# Patient Record
Sex: Male | Born: 2000 | Race: White | Hispanic: No | Marital: Married | Smoking: Never smoker
Health system: Southern US, Community
[De-identification: ages and names within clinical notes are randomized; demographics above are authoritative.]

## PROBLEM LIST (undated history)

## (undated) DIAGNOSIS — F32A Depression, unspecified: Secondary | ICD-10-CM

## (undated) DIAGNOSIS — G249 Dystonia, unspecified: Secondary | ICD-10-CM

## (undated) DIAGNOSIS — F840 Autistic disorder: Secondary | ICD-10-CM

## (undated) DIAGNOSIS — Z87442 Personal history of urinary calculi: Secondary | ICD-10-CM

## (undated) DIAGNOSIS — F909 Attention-deficit hyperactivity disorder, unspecified type: Secondary | ICD-10-CM

## (undated) DIAGNOSIS — F419 Anxiety disorder, unspecified: Secondary | ICD-10-CM

## (undated) HISTORY — DX: Anxiety disorder, unspecified: F41.9

## (undated) HISTORY — DX: Dystonia, unspecified: G24.9

## (undated) HISTORY — DX: Attention-deficit hyperactivity disorder, unspecified type: F90.9

## (undated) HISTORY — DX: Personal history of urinary calculi: Z87.442

## (undated) HISTORY — PX: HAND SURGERY: SHX662

## (undated) HISTORY — PX: WISDOM TOOTH EXTRACTION: SHX21

## (undated) HISTORY — DX: Depression, unspecified: F32.A

---
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
## 2018-01-11 ENCOUNTER — Encounter: Payer: Self-pay | Admitting: Family Medicine

## 2018-01-11 ENCOUNTER — Other Ambulatory Visit: Payer: Self-pay

## 2018-01-11 ENCOUNTER — Ambulatory Visit (INDEPENDENT_AMBULATORY_CARE_PROVIDER_SITE_OTHER): Payer: BC Managed Care – PPO

## 2018-01-11 ENCOUNTER — Ambulatory Visit (INDEPENDENT_AMBULATORY_CARE_PROVIDER_SITE_OTHER): Payer: BC Managed Care – PPO | Admitting: Family Medicine

## 2018-01-11 VITALS — BP 110/66 | HR 82 | Temp 97.6°F | Ht 72.5 in | Wt 197.5 lb

## 2018-01-11 DIAGNOSIS — M79672 Pain in left foot: Secondary | ICD-10-CM

## 2018-01-11 NOTE — Patient Instructions (Signed)
Foot Sprain A foot sprain is an injury to one of the strong bands of tissue (ligaments) that connect and support the many bones in your feet. The ligament can be stretched too much or it can tear. A tear can be either partial or complete. The severity of the sprain depends on how much of the ligament was damaged or torn. What are the causes? A foot sprain is usually caused by suddenly twisting or pivoting your foot. What increases the risk? This injury is more likely to occur in people who:  Play a sport, such as basketball or football.  Exercise or play a sport without warming up.  Start a new workout or sport.  Suddenly increase how long or hard they exercise or play a sport.  What are the signs or symptoms? Symptoms of this condition start soon after an injury and include:  Pain, especially in the arch of the foot.  Bruising.  Swelling.  Inability to walk or use the foot to support body weight.  How is this diagnosed? This condition is diagnosed with a medical history and physical exam. You may also have imaging tests, such as:  X-rays to make sure there are no broken bones (fractures).  MRI to see if the ligament has torn.  How is this treated? Treatment varies depending on the severity of your sprain. Mild sprains can be treated with rest, ice, compression, and elevation (RICE). If your ligament is overstretched or partially torn, treatment usually involves keeping your foot in a fixed position (immobilization) for a period of time. To help you do this, your health care provider will apply a bandage, splint, or walking boot to keep your foot from moving until it heals. You may also be advised to use crutches or a scooter for a few weeks to avoid bearing weight on your foot while it is healing. If your ligament is fully torn, you may need surgery to reconnect the ligament to the bone. After surgery, a cast or splint will be applied and will need to stay on your foot while it  heals. Your health care provider may also suggest exercises or physical therapy to strengthen your foot. Follow these instructions at home: If You Have a Bandage, Splint, or Walking Boot:  Wear it as directed by your health care provider. Remove it only as directed by your health care provider.  Loosen the bandage, splint, or walking boot if your toes become numb and tingle, or if they turn cold and blue. Bathing  If your health care provider approves bathing and showering, cover the bandage or splint with a watertight plastic bag to protect it from water. Do not let the bandage or splint get wet. Managing pain, stiffness, and swelling  If directed, apply ice to the injured area: ? Put ice in a plastic bag. ? Place a towel between your skin and the bag. ? Leave the ice on for 20 minutes, 2-3 times per day.  Move your toes often to avoid stiffness and to lessen swelling.  Raise (elevate) the injured area above the level of your heart while you are sitting or lying down. Driving  Do not drive or operate heavy machinery while taking pain medicine.  Ask your health care provider when it is safe to drive if you have a bandage, splint, or walking boot on your foot. Activity  Rest as directed by your health care provider.  Do not use the injured foot to support your body weight until your health   care provider says that you can. Use crutches or other supportive devices as directed by your health care provider.  Ask your health care provider what activities are safe for you. Gradually increase how much and how far you walk until your health care provider says it is safe to return to full activity.  Do any exercise or physical therapy as directed by your health care provider. General instructions  If a splint was applied, do not put pressure on any part of it until it is fully hardened. This may take several hours.  Take medicines only as directed by your health care provider. These  include over-the-counter medicines and prescription medicines.  Keep all follow-up visits as directed by your health care provider. This is important.  When you can walk without pain, wear supportive shoes that have stiff soles. Do not wear flip-flops, and do not walk barefoot. Contact a health care provider if:  Your pain is not controlled with medicine.  Your bruising or swelling gets worse or does not get better with treatment.  Your splint or walking boot is damaged. Get help right away if:  You develop severe numbness or tingling in your foot.  Your foot turns blue, white, or gray, and it feels cold. This information is not intended to replace advice given to you by your health care provider. Make sure you discuss any questions you have with your health care provider. Document Released: 09/26/2001 Document Revised: 09/12/2015 Document Reviewed: 02/07/2014 Elsevier Interactive Patient Education  2018 [employer]  

## 2018-01-11 NOTE — Progress Notes (Signed)
  Subjective:     Patient ID: Russell DeisGeorge Lynn Rhome Jr., male   DOB: 07/30/2000, 17 y.o.   MRN: [id number]  HPI  Seen with left foot pain.  Was seen here a few weeks ago for similar.  Denies specific injury.  Recent change of shoes but is not sure that is the culprit.  Pain is somewhat poorly localized still somewhat midfoot and somewhat medial.  Still no swelling or redness or bruising.  Ambulating without limp. No ankle pain.  Pain is relatively mild.  Past Medical History:  Diagnosis Date  . ADHD (attention deficit hyperactivity disorder)   . Autism    Past Surgical History:  Procedure Laterality Date  . WISDOM TOOTH EXTRACTION      reports that he has never smoked. He has never used smokeless tobacco. He reports that he does not drink alcohol or use drugs. family history includes Diabetes in his father; Kidney Stones in his father and mother. Allergies  Allergen Reactions  . Other     Cayenne pepper causes diarrhea     Review of Systems  Constitutional: Negative for chills and fever.  Neurological: Negative for weakness and numbness.       Objective:   Physical Exam  Constitutional: He appears well-developed and well-nourished.  Cardiovascular: Normal rate and regular rhythm.  Musculoskeletal:  Ankle full range of motion.  Left foot no swelling, redness, or bruising.  Minimal tenderness along the mid aspect of the foot but very poorly localized.  Minimal tenderness first metatarsal midshaft.       Assessment:     Nonspecific left foot pain of one-month duration.  No reported injury.  Question mild sprain    Plan:     -Check x-rays of left foot to further evaluate-no acute bony abnormality seen and this will be over read -Recommend good comfortable shoe wear -He will try over-the-counter anti-inflammatory as needed -Recommend touch base in 1 month if pain not resolving.  See no indication for MRI at this time

## 2018-03-22 ENCOUNTER — Ambulatory Visit: Payer: BC Managed Care – PPO | Admitting: Family Medicine

## 2018-03-22 ENCOUNTER — Encounter: Payer: Self-pay | Admitting: Family Medicine

## 2018-03-22 ENCOUNTER — Other Ambulatory Visit: Payer: Self-pay

## 2018-03-22 VITALS — BP 120/84 | HR 82 | Temp 98.2°F | Ht 73.5 in | Wt 199.8 lb

## 2018-03-22 DIAGNOSIS — S61412A Laceration without foreign body of left hand, initial encounter: Secondary | ICD-10-CM | POA: Diagnosis not present

## 2018-03-22 NOTE — Progress Notes (Signed)
  Subjective:     Patient ID: Russell DeisGeorge Lynn Hartje Jr., male   DOB: 05/23/2000, 17 y.o.   MRN: [id number]  HPI Patient seen with laceration left hand near the base of the left thumb- left thenar eminence.  This occurred around 11:30 AM today.  He cut this on a piece of glass.  He had a printer surface that he was replacing with a glass surface and the glass ruptured.  He is not aware of any foreign bodies.  He had some mild bleeding afterwards.  He had tetanus 2018.  He went to another office and they apparently bandaged this and he was not a patient there and he was sent this way.  He has had full sensation in the left thumb.  Full range of motion all digits.  No pain.  Past Medical History:  Diagnosis Date  . ADHD (attention deficit hyperactivity disorder)   . Autism    Past Surgical History:  Procedure Laterality Date  . WISDOM TOOTH EXTRACTION      reports that he has never smoked. He has never used smokeless tobacco. He reports that he does not drink alcohol or use drugs. family history includes Diabetes in his father; Kidney Stones in his father and mother. Allergies  Allergen Reactions  . Other     Cayenne pepper causes diarrhea     Review of Systems  Neurological: Negative for weakness and numbness.       Objective:   Physical Exam  Constitutional: He appears well-developed and well-nourished.  Cardiovascular: Normal rate and regular rhythm.  Skin:  Has approximately 1-1/2 cm vertically oriented minimally gaping laceration near the base of the left thumb-volar surface of thenar eminence.  No active bleeding.  No foreign body seen or palpated.  Full range of motion left thumb       Assessment:     Uncomplicated laceration left hand- thenar eminence..  No foreign bodies noted.    Plan:     -We have recommended suture repair.  He would be at high risk of opening this back up and slow healing given location-without repair. -pt consented to laceration repair. -prepped  skin with betadine.  Anesthesia locally with 1% plain Xylocaine.  Irrigated wound with sterile saline Closed with 2 sutures of 4-0 ethilon.  Minimal bleeding. Topical dressing applied. -- wound care instruction given. -return in 8 days for suture removal and sooner as needed for any signs of infection.  Kristian CoveyBruce W Arvon Schreiner MD [facility]

## 2018-03-22 NOTE — Patient Instructions (Signed)
Keep wound dry for 24 hours Clean daily with soap and water after 24 hours Consider small amount of topical Vaseline to prevent drying Keep wound covered until follow-up Return in 8-10 days for suture removal Follow-up sooner for any signs of secondary infection such as pus drainage, increased swelling, or increased redness.

## 2018-04-01 ENCOUNTER — Ambulatory Visit: Payer: BC Managed Care – PPO | Admitting: Family Medicine

## 2018-04-19 IMAGING — CT CT RENAL STONE PROTOCOL
2 of 4 series · 16 of 46 positions shown, 18 images · non-contrast
Comparison: None

CLINICAL DATA: Intermittent LEFT flank pain since 9979 hours today
question kidney stone disease

EXAM:
CT ABDOMEN AND PELVIS WITHOUT CONTRAST
TECHNIQUE: Multidetector CT imaging of the abdomen and pelvis was performed
following the standard protocol without IV contrast. Oral contrast
was not administered for this indication. Sagittal and coronal MPR
images reconstructed from axial data set.

[Series 2: axial st · axial · 0.76mm/px · z∈[+1132,+1578]mm · 13 of 99 slices shown, 15 images]
[im 5/99  soft-tissue]
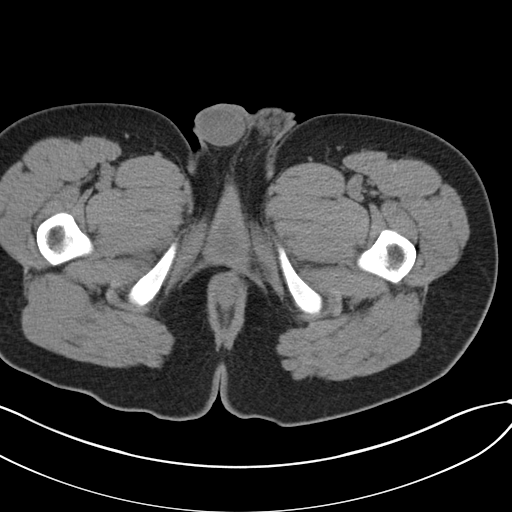
[im 5/99  bone]
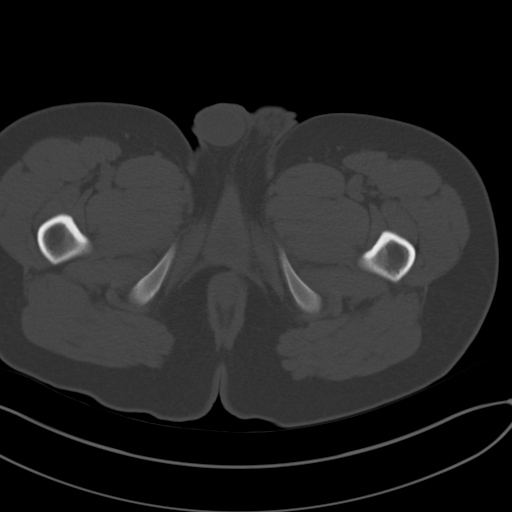
[im 15/99  soft-tissue]
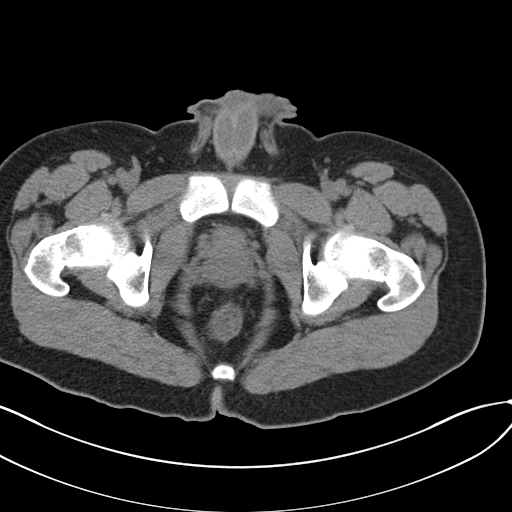
[im 19/99  soft-tissue]
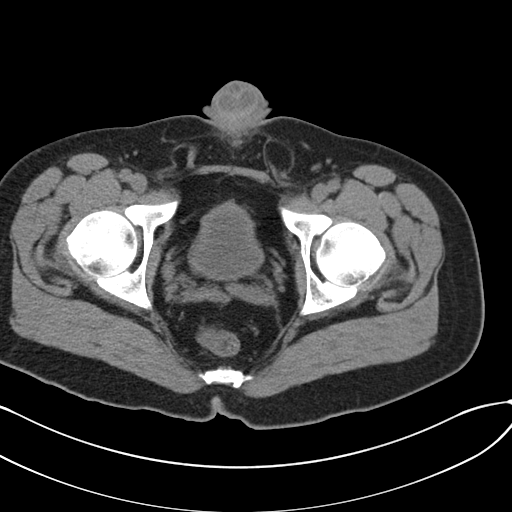
[im 29/99  soft-tissue]
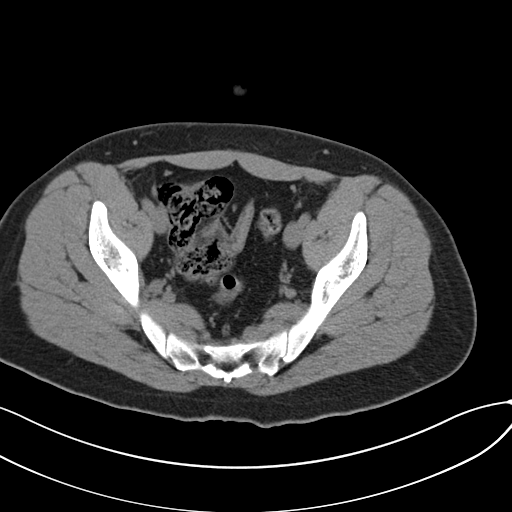
[im 33/99  soft-tissue]
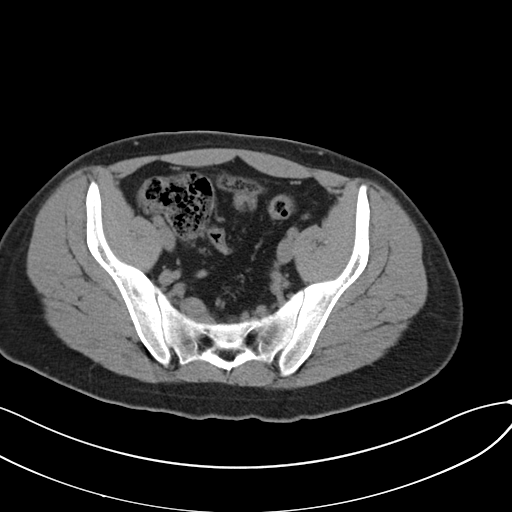
[im 43/99  soft-tissue]
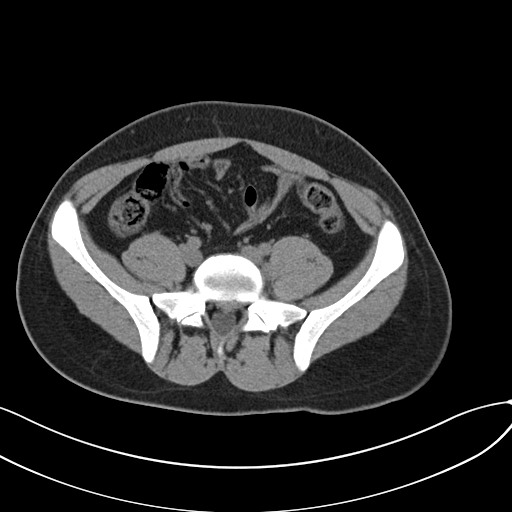
[im 52/99  soft-tissue]
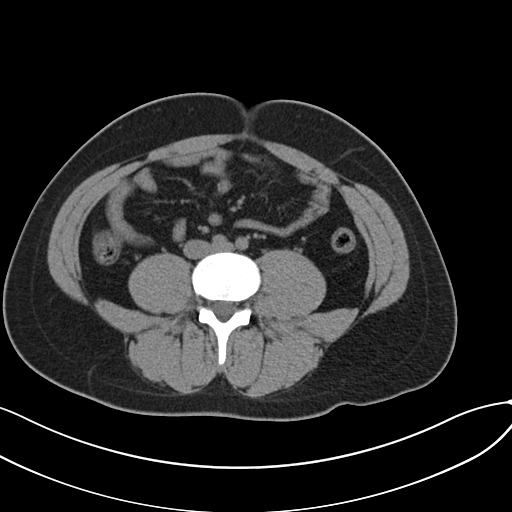
[im 57/99  soft-tissue]
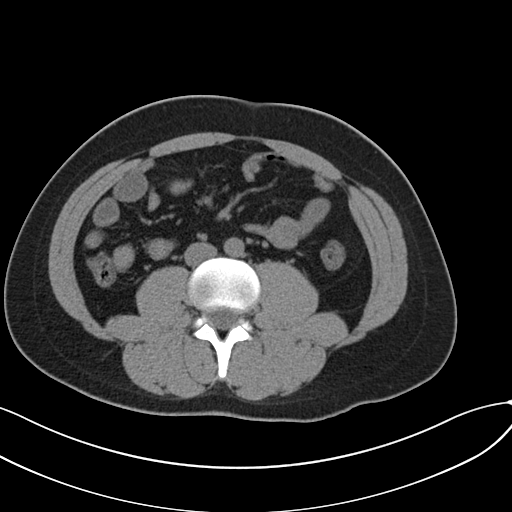
[im 66/99  soft-tissue]
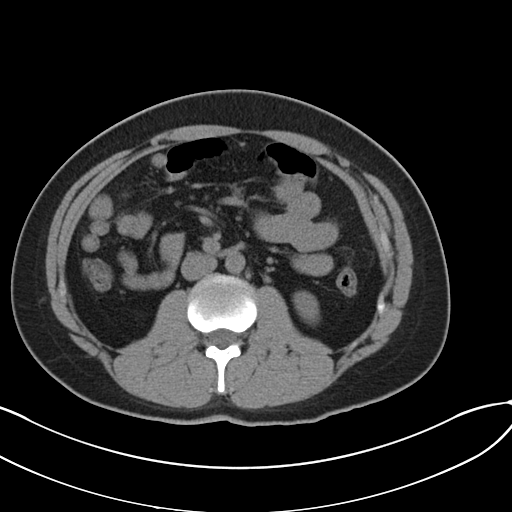
[im 66/99  bone]
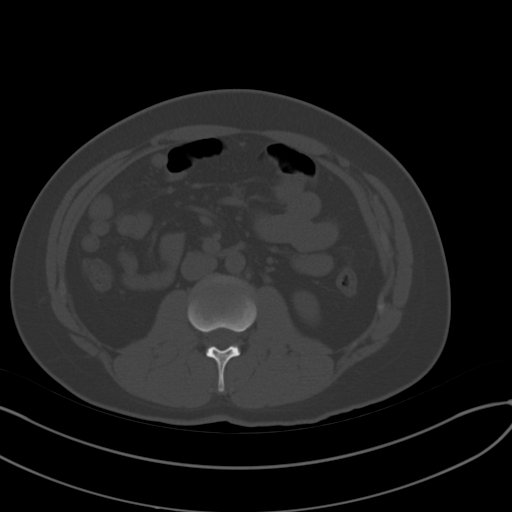
[im 71/99  soft-tissue]
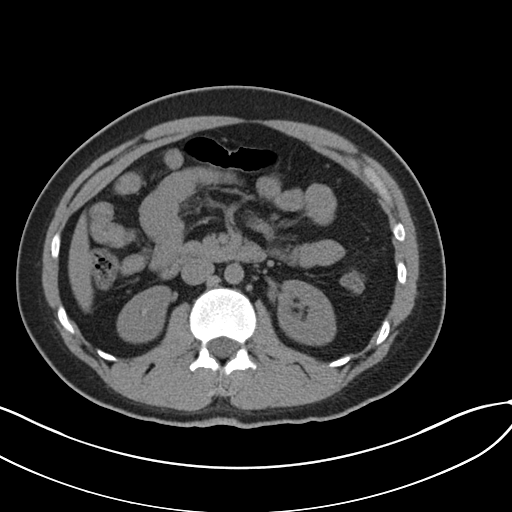
[im 80/99  soft-tissue]
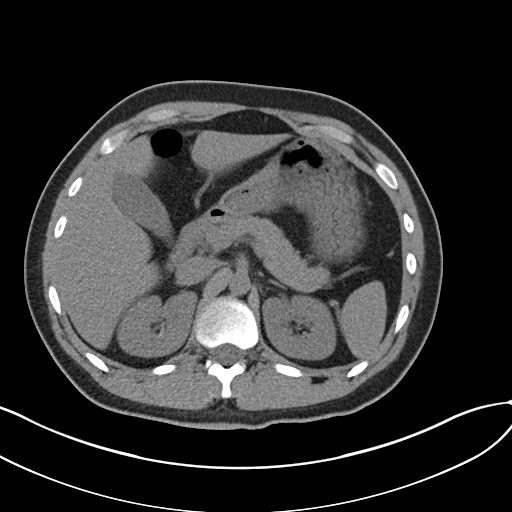
[im 85/99  soft-tissue]
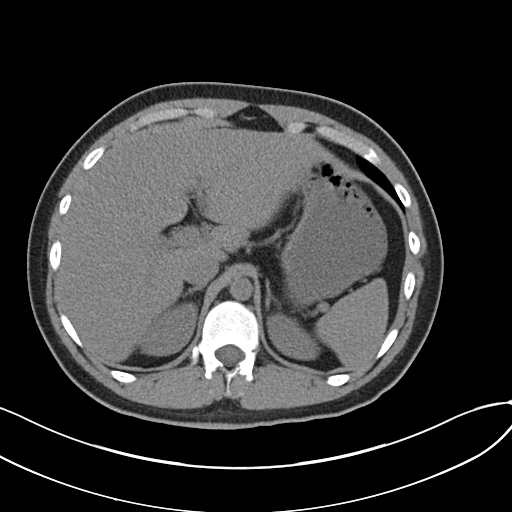
[im 94/99  soft-tissue]
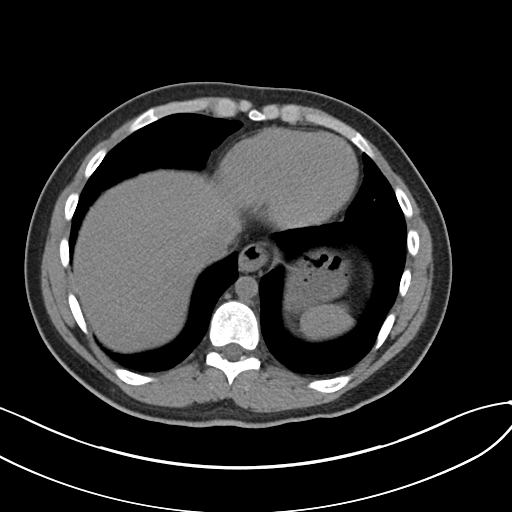

[Series 5: coronal · coronal · 0.76mm/px · 3 of 151 slices shown]
[im 51/151  soft-tissue]
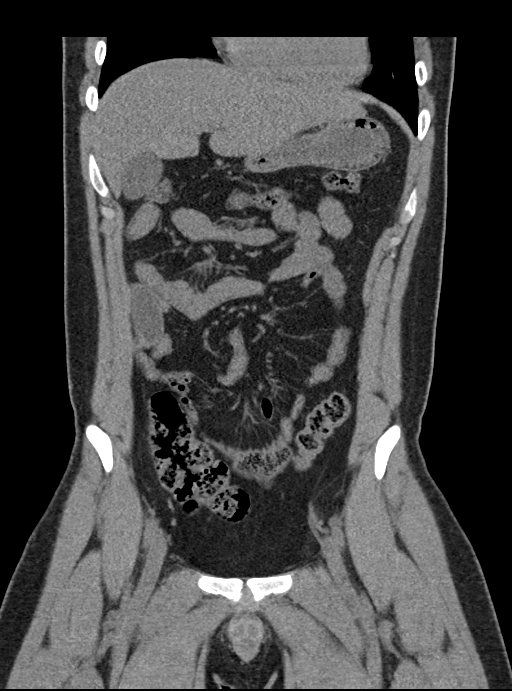
[im 67/151  soft-tissue]
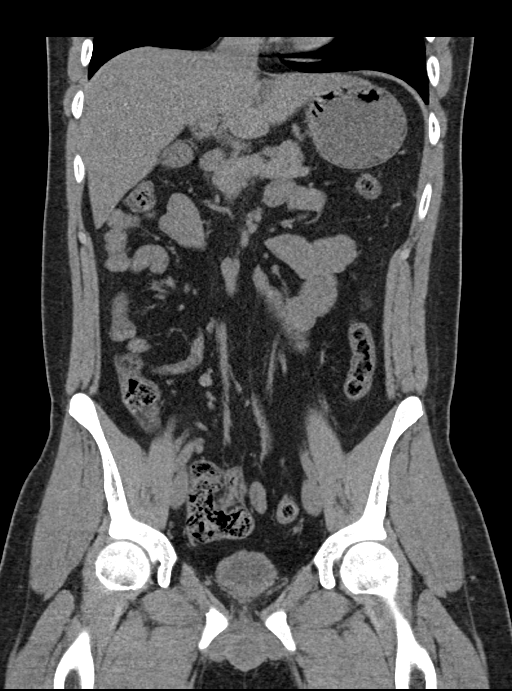
[im 84/151  soft-tissue]
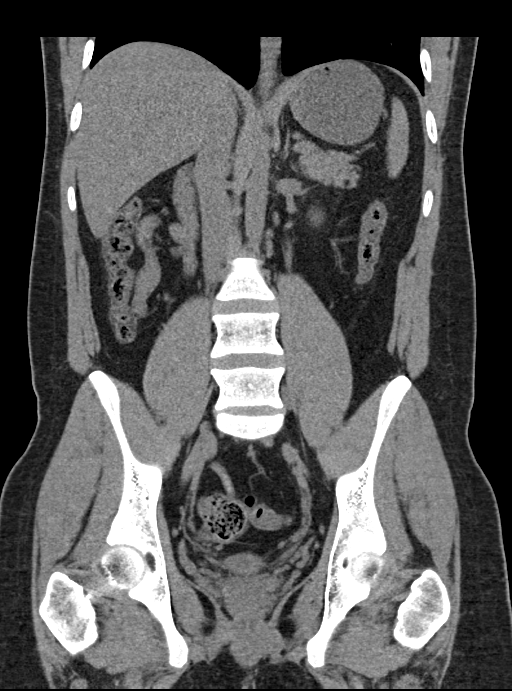

[16 of 46 positions shown; findings below may reference images not displayed]

FINDINGS: Lower chest: Minimal dependent atelectasis at LEFT lung base

Hepatobiliary: Gallbladder and liver normal appearance

Pancreas: Normal appearance

Spleen: Normal appearance

Adrenals/Urinary Tract: Adrenal glands normal appearance. Tiny
BILATERAL nonobstructing renal calculi. Mild LEFT peripelvic and
proximal periureteral edema. No definite ureteral calcification
identified. Bladder decompressed.

Stomach/Bowel: Normal appendix. Stomach and bowel loops normal
appearance

Vascular/Lymphatic: Vascular structures unremarkable. No adenopathy.

Reproductive: Unremarkable

Other: Small BILATERAL inguinal hernias containing fat. No free
intraperitoneal air, free fluid, or acute inflammatory process. Tiny
umbilical hernia containing fat.

Musculoskeletal: Unremarkable
IMPRESSION: Tiny BILATERAL nonobstructing renal calculi.

Mild LEFT peripelvic and proximal periureteral edema without
visualized ureteral calculus; possibility of a passed calculus is
raised.

Small BILATERAL inguinal and tiny umbilical hernias containing fat.

## 2018-04-23 LAB — LIPID PANEL
HDL: 48 (ref 35–70)
LDL Cholesterol: 14

## 2018-04-23 LAB — HEPATIC FUNCTION PANEL
ALT: 29 (ref 10–40)
AST: 22 (ref 14–40)
Alkaline Phosphatase: 118 (ref 25–125)
Bilirubin, Total: 0.4

## 2018-04-23 LAB — HEMOGLOBIN A1C: Hemoglobin A1C: 5.4

## 2018-04-23 LAB — BASIC METABOLIC PANEL
BUN: 18 (ref 4–21)
Creatinine: 1 (ref 0.6–1.3)
Glucose: 84
Potassium: 4.8 (ref 3.4–5.3)
Sodium: 141 (ref 137–147)

## 2018-04-23 LAB — CBC AND DIFFERENTIAL
Platelets: 297 (ref 150–399)
WBC: 6.2

## 2018-04-23 LAB — VITAMIN D 25 HYDROXY (VIT D DEFICIENCY, FRACTURES): Vit D, 25-Hydroxy: 31.7

## 2018-05-02 ENCOUNTER — Other Ambulatory Visit: Payer: Self-pay

## 2018-05-02 ENCOUNTER — Ambulatory Visit (INDEPENDENT_AMBULATORY_CARE_PROVIDER_SITE_OTHER): Payer: BC Managed Care – PPO | Admitting: Family Medicine

## 2018-05-02 ENCOUNTER — Encounter: Payer: Self-pay | Admitting: Family Medicine

## 2018-05-02 VITALS — BP 124/80 | HR 93 | Temp 98.1°F | Ht 73.5 in | Wt 202.5 lb

## 2018-05-02 DIAGNOSIS — L230 Allergic contact dermatitis due to metals: Secondary | ICD-10-CM

## 2018-05-02 MED ORDER — TRIAMCINOLONE ACETONIDE 0.1 % EX CREA: 1.0000 "application " | TOPICAL_CREAM | Freq: Two times a day (BID) | CUTANEOUS | 1 refills | Status: DC | PRN

## 2018-05-02 NOTE — Patient Instructions (Signed)
Contact Dermatitis  Dermatitis is redness, soreness, and swelling (inflammation) of the skin. Contact dermatitis is a reaction to certain substances that touch the skin. Many different substances can cause contact dermatitis. There are two types of contact dermatitis:   Irritant contact dermatitis. This type is caused by something that irritates your skin, such as having dry hands from washing them too often with soap. This type does not require previous exposure to the substance for a reaction to occur. This is the most common type.   Allergic contact dermatitis. This type is caused by a substance that you are allergic to, such as poison ivy. This type occurs when you have been exposed to the substance (allergen) and develop a sensitivity to it. Dermatitis may develop soon after your first exposure to the allergen, or it may not develop until the next time you are exposed and every time thereafter.  What are the causes?  Irritant contact dermatitis is most commonly caused by exposure to:   Makeup.   Soaps.   Detergents.   Bleaches.   Acids.   Metal salts, such as nickel.  Allergic contact dermatitis is most commonly caused by exposure to:   Poisonous plants.   Chemicals.   Jewelry.   Latex.   Medicines.   Preservatives in products, such as clothing.  What increases the risk?  You are more likely to develop this condition if you have:   A job that exposes you to irritants or allergens.   Certain medical conditions, such as asthma or eczema.  What are the signs or symptoms?  Symptoms of this condition may occur on your body anywhere the irritant has touched you or is touched by you.   Symptoms include:  ? Dryness or flaking.  ? Redness.  ? Cracks.  ? Itching.  ? Pain or a burning feeling.  ? Blisters.  ? Drainage of small amounts of blood or clear fluid from skin cracks.  With allergic contact dermatitis, there may also be swelling in areas such as the eyelids, mouth, or genitals.  How is this  diagnosed?  This condition is diagnosed with a medical history and physical exam.   A patch skin test may be performed to help determine the cause.   If the condition is related to your job, you may need to see an occupational medicine specialist.  How is this treated?  This condition is treated by checking for the cause of the reaction and protecting your skin from further contact. Treatment may also include:   Steroid creams or ointments. Oral steroid medicines may be needed in more severe cases.   Antibiotic medicines or antibacterial ointments, if a skin infection is present.   Antihistamine lotion or an antihistamine taken by mouth to ease itching.   A bandage (dressing).  Follow these instructions at home:  Skin care   Moisturize your skin as needed.   Apply cool compresses to the affected areas.   Try applying baking soda paste to your skin. Stir water into baking soda until it reaches a paste-like consistency.   Do not scratch your skin, and avoid friction to the affected area.   Avoid the use of soaps, perfumes, and dyes.  Medicines   Take or apply over-the-counter and prescription medicines only as told by your health care provider.   If you were prescribed an antibiotic medicine, take or apply the antibiotic as told by your health care provider. Do not stop using the antibiotic even if your condition   improves.  Bathing   Try taking a bath with:  ? Epsom salts. Follow the instructions on the packaging. You can get these at your local pharmacy or grocery store.  ? Baking soda. Pour a small amount into the bath as directed by your health care provider.  ? Colloidal oatmeal. Follow the instructions on the packaging. You can get this at your local pharmacy or grocery store.   Bathe less frequently, such as every other day.   Bathe in lukewarm water. Avoid using hot water.  Bandage care   If you were given a bandage (dressing), change it as told by your health care provider.   Wash your hands  with soap and water before and after you change your dressing. If soap and water are not available, use hand sanitizer.  General instructions   Avoid the substance that caused your reaction. If you do not know what caused it, keep a journal to try to track what caused it. Write down:  ? What you eat.  ? What cosmetic products you use.  ? What you drink.  ? What you wear in the affected area. This includes jewelry.   Check the affected areas every day for signs of infection. Check for:  ? More redness, swelling, or pain.  ? More fluid or blood.  ? Warmth.  ? Pus or a bad smell.   Keep all follow-up visits as told by your health care provider. This is important.  Contact a health care provider if:   Your condition does not improve with treatment.   Your condition gets worse.   You have signs of infection such as swelling, tenderness, redness, soreness, or warmth in the affected area.   You have a fever.   You have new symptoms.  Get help right away if:   You have a severe headache, neck pain, or neck stiffness.   You vomit.   You feel very sleepy.   You notice red streaks coming from the affected area.   Your bone or joint underneath the affected area becomes painful after the skin has healed.   The affected area turns darker.   You have difficulty breathing.  Summary   Dermatitis is redness, soreness, and swelling (inflammation) of the skin. Contact dermatitis is a reaction to certain substances that touch the skin.   Symptoms of this condition may occur on your body anywhere the irritant has touched you or is touched by you.   This condition is treated by figuring out what caused the reaction and protecting your skin from further contact. Treatment may also include medicines and skin care.   Avoid the substance that caused your reaction. If you do not know what caused it, keep a journal to try to track what caused it.   Contact a health care provider if your condition gets worse or you have signs  of infection such as swelling, tenderness, redness, soreness, or warmth in the affected area.  This information is not intended to replace advice given to you by your health care provider. Make sure you discuss any questions you have with your health care provider.  Document Released: 04/03/2000 Document Revised: 10/20/2017 Document Reviewed: 10/20/2017  Elsevier Interactive Patient Education  2019 [employer]

## 2018-05-02 NOTE — Progress Notes (Signed)
  Subjective:     Patient ID: Russell Deis., male   DOB: 08/25/2000, 18 y.o.   MRN: [id number]  HPI Patient is seen with pruritic rash lower abdominal region right at his waist.  First noted a couple weeks ago.  Moderate pruritus.  He initially thought this was fungal and tried over-the-counter Tinactin without any relief.  He does wear a belt most days and the area of rash corresponds exactly with where his belt buckle touches the skin.  He is not sure which type of belt buckle he has but this is metallic.  No history of known metal allergy in the past  Past Medical History:  Diagnosis Date  . ADHD (attention deficit hyperactivity disorder)   . Autism    Past Surgical History:  Procedure Laterality Date  . WISDOM TOOTH EXTRACTION      reports that he has never smoked. He has never used smokeless tobacco. He reports that he does not drink alcohol or use drugs. family history includes Diabetes in his father; Kidney Stones in his father and mother. Allergies  Allergen Reactions  . Other     Cayenne pepper causes diarrhea     Review of Systems  Skin: Positive for rash.       Objective:   Physical Exam Constitutional:      Appearance: Normal appearance.  Skin:    Comments: Patient has erythematous macular rash mid lower abdominal region around the waist line at the site of where his belt buckle touches the skin,  Neurological:     Mental Status: He is alert.        Assessment:     Contact allergy confined lower waist region.  Question nickel allergy.    Plan:     -Leave off antifungals -We have encouraged him to look for another belt-and especially one that has no nickel or metal that would be in contact with skin -Triamcinolone 0.1% cream twice daily as needed  Kristian Covey MD [facility] at [facility]

## 2018-05-16 ENCOUNTER — Encounter: Payer: Self-pay | Admitting: Family Medicine

## 2018-05-16 ENCOUNTER — Ambulatory Visit (INDEPENDENT_AMBULATORY_CARE_PROVIDER_SITE_OTHER): Payer: BC Managed Care – PPO | Admitting: Family Medicine

## 2018-05-16 VITALS — BP 122/76 | HR 98 | Temp 98.1°F | Resp 12 | Ht 73.5 in | Wt 200.0 lb

## 2018-05-16 DIAGNOSIS — H6591 Unspecified nonsuppurative otitis media, right ear: Secondary | ICD-10-CM | POA: Diagnosis not present

## 2018-05-16 DIAGNOSIS — H905 Unspecified sensorineural hearing loss: Secondary | ICD-10-CM | POA: Diagnosis not present

## 2018-05-16 DIAGNOSIS — J069 Acute upper respiratory infection, unspecified: Secondary | ICD-10-CM

## 2018-05-16 DIAGNOSIS — H9190 Unspecified hearing loss, unspecified ear: Secondary | ICD-10-CM

## 2018-05-16 DIAGNOSIS — H6981 Other specified disorders of Eustachian tube, right ear: Secondary | ICD-10-CM

## 2018-05-16 MED ORDER — AMOXICILLIN-POT CLAVULANATE 875-125 MG PO TABS: 1.0000 | ORAL_TABLET | Freq: Two times a day (BID) | ORAL | 0 refills | Status: AC

## 2018-05-16 NOTE — Patient Instructions (Addendum)
  [name]. I have seen you today for an acute visit.  A few things to remember from today's visit:   Perceived hearing loss  Eustachian tube dysfunction, right  URI, acute  Right otitis media with effusion - Plan: amoxicillin-clavulanate (AUGMENTIN) 875-125 MG tablet   If medications prescribed today, they will not be refill upon request, a follow up appointment with PCP will be necessary to discuss continuation of of treatment if appropriate.  Voice rest. Nasal irrigation with saline several times per day. Over-the-counter antihistaminic like Zyrtec may help. Sudafed 12 hours daily in the morning for 7 to 10 days. Popping ear albuterol during the day may also help.  Debrox in left ear for wax,no Q tips.   In general please monitor for signs of worsening symptoms and seek immediate medical attention if any concerning.    I hope you get better soon!

## 2018-05-16 NOTE — Progress Notes (Signed)
ACUTE VISIT  HPI:  Chief Complaint  Patient presents with  . Otalgia    right ear pain, unable to hear good  . Laryngitis    sx started last Wednesday    RussellRussell Beard. is a 18 y.o.male here today complaining of 5 days of respiratory symptoms. Nasal congestion, rhinorrhea, postnasal drainage, and non-productive cough.  Yesterday he started with intermittent ear ache and constant hearing loss perception of right ear. He has not noted ear drainage. Mild tinnitus right ear, denies prior history.  Yesterday he also started with dysphonia. He denies sore throat, wheezing, stridor, or dyspnea. He has not identified exacerbating or alleviating factors. It seems to be stable.   Otalgia   There is pain in the right ear. This is a new problem. The current episode started yesterday. The problem occurs every few minutes. The problem has been unchanged. There has been no fever. The pain is moderate. Associated symptoms include coughing, hearing loss, rhinorrhea and a sore throat. Pertinent negatives include no abdominal pain, diarrhea, ear discharge, headaches, neck pain, rash or vomiting. There is no history of a chronic ear infection.     No Hx of recent travel. GERD was also sick with URI symptoms but she feeling better now, lasted 2 days. No known insect bite.  Hx of allergies: Denies.  OTC medications for this problem: "Cold medications."  He has not tried OTC analgesics for earache.   Review of Systems  Constitutional: Positive for fatigue. Negative for activity change, appetite change, chills and fever.  HENT: Positive for congestion, ear pain, hearing loss, rhinorrhea, sore throat, tinnitus and voice change. Negative for ear discharge, facial swelling, mouth sores, postnasal drip and trouble swallowing.   Eyes: Negative for discharge and redness.  Respiratory: Positive for cough. Negative for chest tightness, shortness of breath and wheezing.     Gastrointestinal: Negative for abdominal pain, diarrhea, nausea and vomiting.  Musculoskeletal: Negative for myalgias and neck pain.  Skin: Negative for rash.  Neurological: Negative for weakness and headaches.  Hematological: Negative for adenopathy. Does not bruise/bleed easily.      Current Outpatient Medications on File Prior to Visit  Medication Sig Dispense Refill  . amphetamine-dextroamphetamine (ADDERALL XR) 30 MG 24 hr capsule Take by mouth daily.  0  . ARIPiprazole (ABILIFY) 20 MG tablet Take 20 mg by mouth every morning.  1  . cloNIDine (CATAPRES) 0.2 MG tablet Take 0.2 mg by mouth at bedtime.  2  . cloNIDine (CATAPRES) 0.3 MG tablet Take 0.3 mg by mouth daily.    Marland Kitchen gabapentin (NEURONTIN) 400 MG capsule Take 400 mg by mouth as needed.   2  . triamcinolone cream (KENALOG) 0.1 % Apply 1 application topically 2 (two) times daily as needed. 30 g 1  . oseltamivir (TAMIFLU) 75 MG capsule      No current facility-administered medications on file prior to visit.      Past Medical History:  Diagnosis Date  . ADHD (attention deficit hyperactivity disorder)   . Autism    Allergies  Allergen Reactions  . Nickel Rash  . Other     Cayenne pepper causes diarrhea    Social History   Socioeconomic History  . Marital status: Single    Spouse name: Not on file  . Number of children: Not on file  . Years of education: Not on file  . Highest education level: Not on file  Occupational History  . Not on file  Social Needs  . Financial resource strain: Not on file  . Food insecurity:    Worry: Not on file    Inability: Not on file  . Transportation needs:    Medical: Not on file    Non-medical: Not on file  Tobacco Use  . Smoking status: Never Smoker  . Smokeless tobacco: Never Used  Substance and Sexual Activity  . Alcohol use: No  . Drug use: No  . Sexual activity: Never    Birth control/protection: Abstinence  Lifestyle  . Physical activity:    Days per week:  Not on file    Minutes per session: Not on file  . Stress: Not on file  Relationships  . Social connections:    Talks on phone: Not on file    Gets together: Not on file    Attends religious service: Not on file    Active member of club or organization: Not on file    Attends meetings of clubs or organizations: Not on file    Relationship status: Not on file  Other Topics Concern  . Not on file  Social History Narrative  . Not on file    Vitals:   05/16/18 1151  BP: 122/76  Pulse: 98  Resp: 12  Temp: 98.1 F (36.7 C)  SpO2: 97%   Body mass index is 26.03 kg/m.    Physical Exam  Nursing note and vitals reviewed. Constitutional: He is oriented to person, place, and time. He appears well-developed and well-nourished. He does not appear ill. No distress.  HENT:  Head: Normocephalic and atraumatic.  Right Ear: External ear and ear canal normal. No tenderness. No mastoid tenderness. Tympanic membrane is erythematous (mild) and bulging (mild).  Left Ear: External ear normal.  Nose: Rhinorrhea (copious clear rhinorrhea) present. Right sinus exhibits no maxillary sinus tenderness and no frontal sinus tenderness. Left sinus exhibits no maxillary sinus tenderness and no frontal sinus tenderness.  Mouth/Throat: Oropharynx is clear and moist and mucous membranes are normal.  Left ear canal excess cerumen, I could not appreciate TM.  Eyes: Conjunctivae are normal.  Cardiovascular: Normal rate and regular rhythm.  No murmur heard. Respiratory: Effort normal and breath sounds normal. No stridor. No respiratory distress.  Lymphadenopathy:       Head (right side): No submandibular adenopathy present.       Head (left side): No submandibular adenopathy present.    He has cervical adenopathy.       Right cervical: Posterior cervical adenopathy present.       Left cervical: Posterior cervical adenopathy present.  Neurological: He is alert and oriented to person, place, and time. He has  normal strength.  Skin: Skin is warm. No rash noted. No erythema.  Psychiatric:  Flat affect. Well groomed, good eye contact.      ASSESSMENT AND PLAN:  Mr. Russell Beard was seen today for otalgia and laryngitis.  Diagnoses and all orders for this visit:  Right otitis media with effusion We discussed possible etiologies, including viral. Treatment options discussed. OTC acetaminophen can be used for pain management. Explained that it is appropriate to wait 3 to 4 days before starting medication and to take Augmentin if ear ache is persistent. Follow-up with PCP if still symptomatic in 2 weeks, before if needed.  -     amoxicillin-clavulanate (AUGMENTIN) 875-125 MG tablet; Take 1 tablet by mouth 2 (two) times daily for 7 days.  Perceived hearing loss Hearing test here in the office otherwise normal. He was  clearly instructed about warning signs.   Hearing Screening   Method: Audiometry   125Hz  250Hz  500Hz  1000Hz  2000Hz  3000Hz  4000Hz  6000Hz  8000Hz   Right ear:   Pass Pass Pass  Pass    Left ear:   Pass Pass Pass  Pass      Eustachian tube dysfunction, right Sudafed 12 hours daily in the morning for 5 to 7 days. Auto inflation maneuvers may also help. Side effects of decongestant discussed. Follow-up with PCP as needed.  URI, acute Symptomatic treatment recommended. Nasal irrigations with saline at bedtime during the day and OTC antihistaminic may help with nasal congestion. Explained that cough can last a few days and even weeks after URI.    Return in about 2 weeks (around 05/30/2018), or if symptoms worsen or fail to improve, for pcp.     Betty G. SwazilandJordan, MD  [facility]. [facility].

## 2018-07-28 ENCOUNTER — Encounter: Payer: Self-pay | Admitting: Family Medicine

## 2018-11-29 IMAGING — DX DG FOOT COMPLETE 3+V*L*
3 series · 3 of 3 positions shown · non-contrast
Comparison: None.

CLINICAL DATA: Foot pain for 1 month, no known injury, initial
encounter

EXAM:
LEFT FOOT - COMPLETE 3+ VIEW

[foot ap]
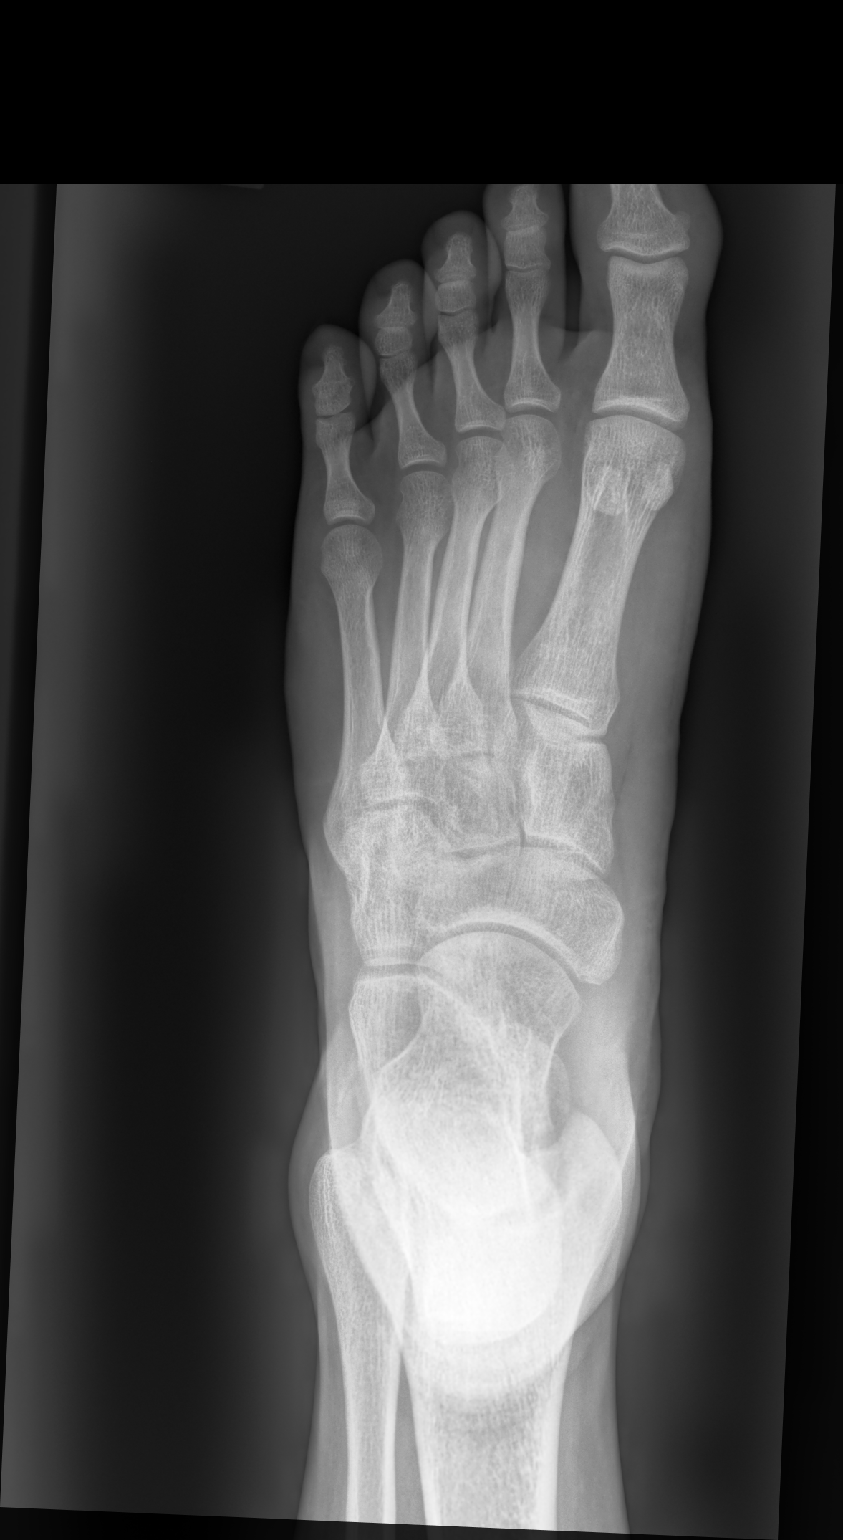

[foot mlo]
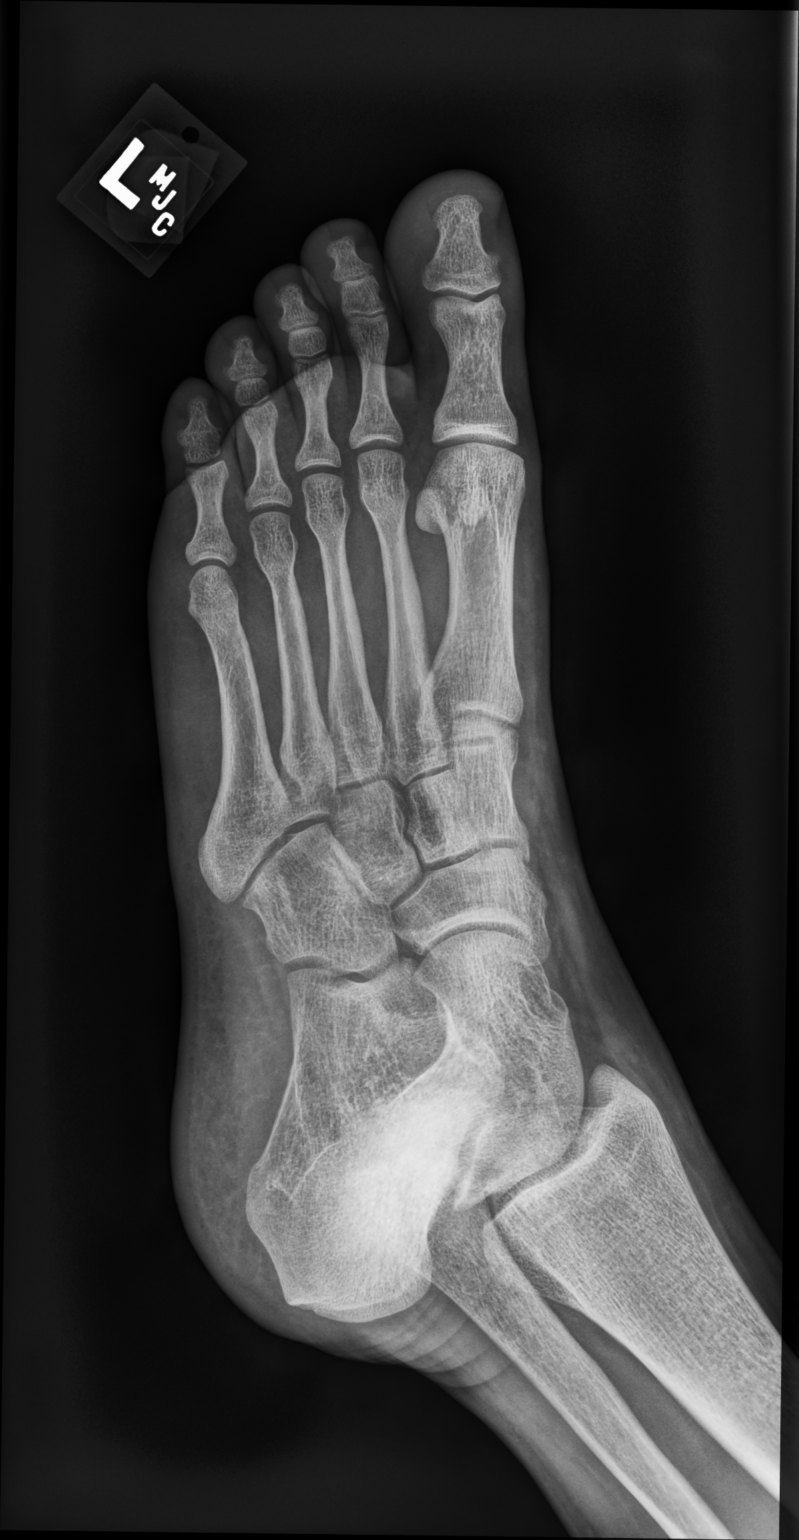

[foot lat]
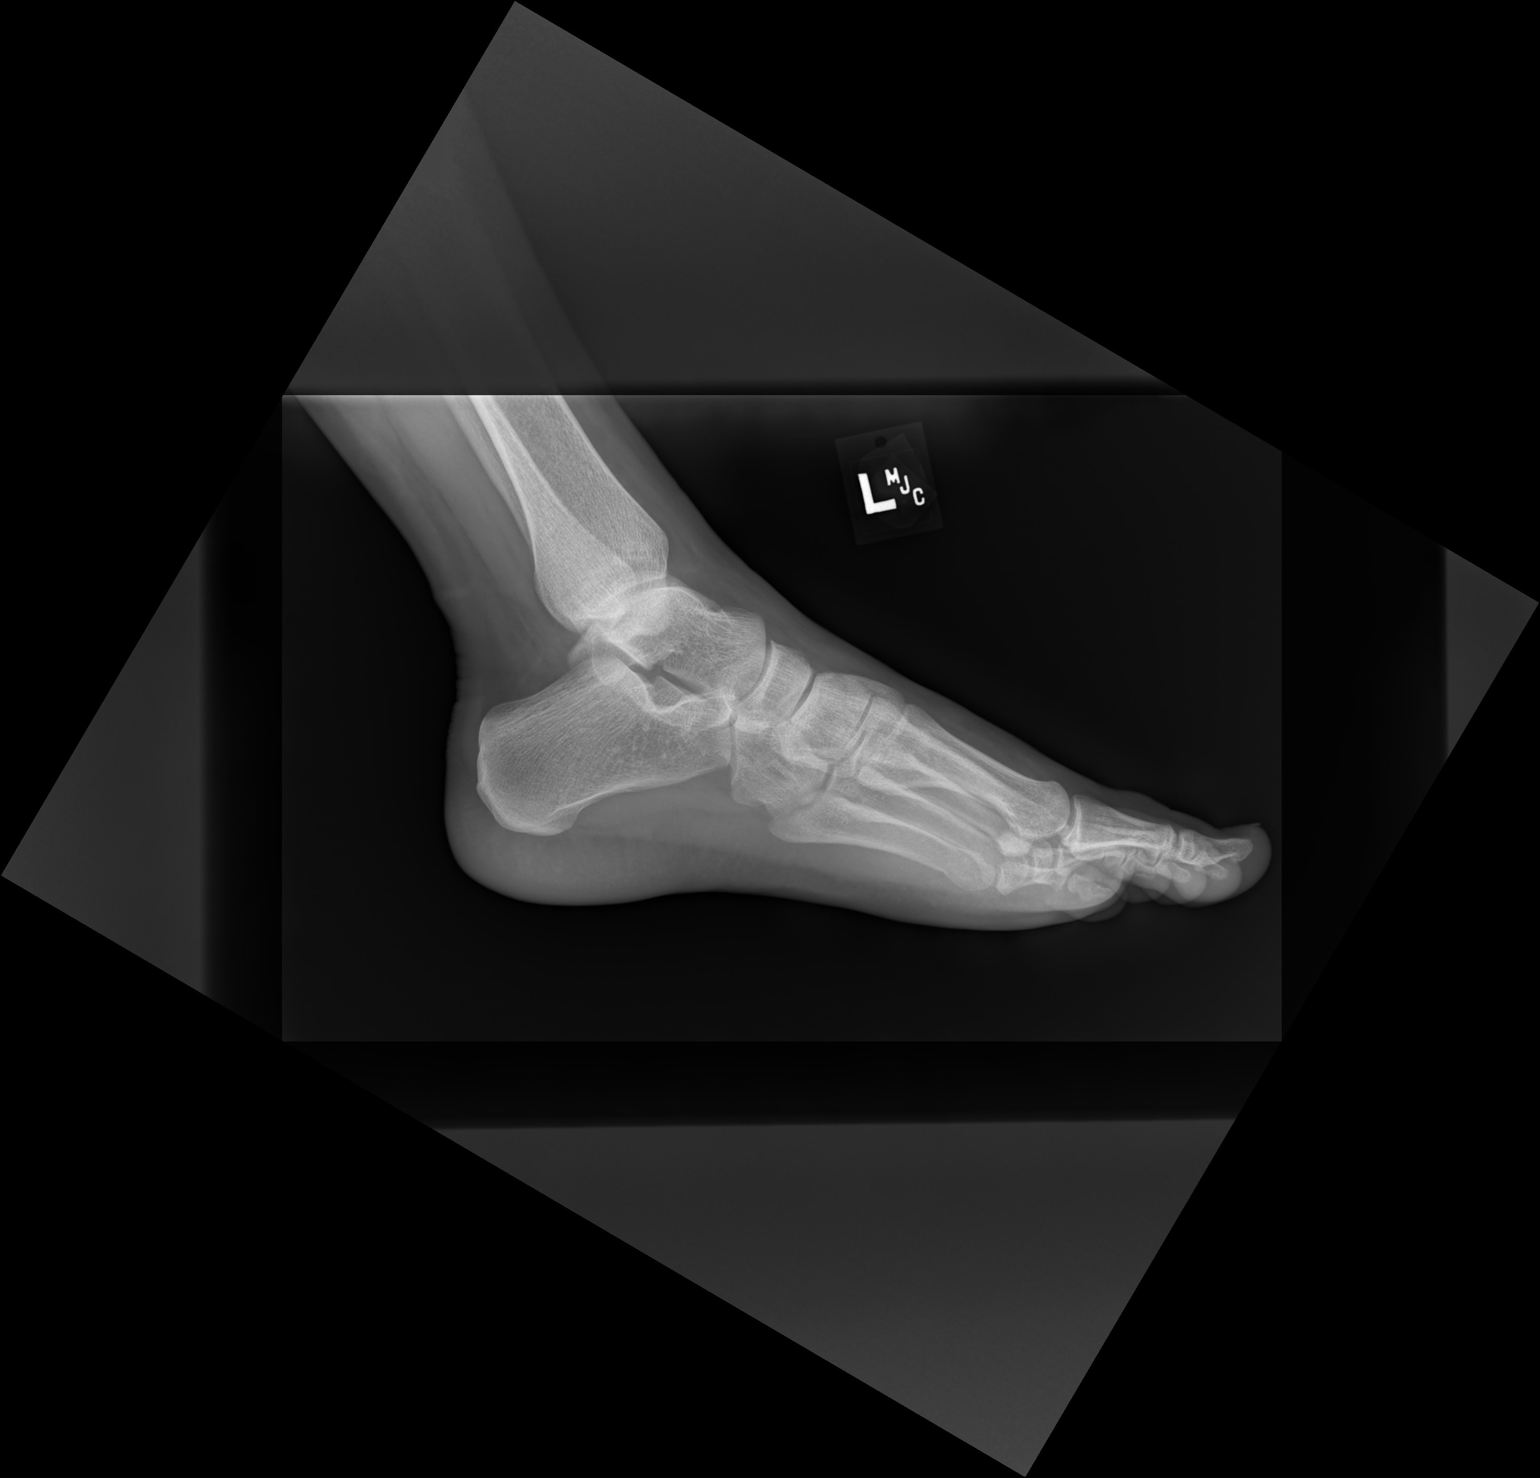

[3 of 3 positions shown; findings below may reference images not displayed]

FINDINGS: There is no evidence of fracture or dislocation. There is no
evidence of arthropathy or other focal bone abnormality. Soft
tissues are unremarkable.
IMPRESSION: No acute abnormality noted.

## 2019-04-05 ENCOUNTER — Other Ambulatory Visit: Payer: BC Managed Care – PPO

## 2019-05-19 ENCOUNTER — Ambulatory Visit: Payer: BC Managed Care – PPO | Attending: Internal Medicine

## 2019-05-19 ENCOUNTER — Other Ambulatory Visit: Payer: Self-pay

## 2019-05-19 DIAGNOSIS — Z20822 Contact with and (suspected) exposure to covid-19: Secondary | ICD-10-CM

## 2019-05-20 LAB — NOVEL CORONAVIRUS, NAA: SARS-CoV-2, NAA: NOT DETECTED

## 2019-07-24 ENCOUNTER — Telehealth (INDEPENDENT_AMBULATORY_CARE_PROVIDER_SITE_OTHER): Payer: BC Managed Care – PPO | Admitting: Family Medicine

## 2019-07-24 DIAGNOSIS — R112 Nausea with vomiting, unspecified: Secondary | ICD-10-CM | POA: Diagnosis not present

## 2019-07-24 NOTE — Progress Notes (Signed)
Patient ID: Russell Otting., male   DOB: 25-Dec-2000, 19 y.o.   MRN: [id number]  This visit type was conducted due to national recommendations for restrictions regarding the COVID-19 pandemic in an effort to limit this patient's exposure and mitigate transmission in our community.   Virtual Visit via Video Note  I connected with Russell Beard on 07/24/19 at 11:30 AM EDT by a video enabled telemedicine application and verified that I am speaking with the correct person using two identifiers.  Location patient: home Location provider:work or home office Persons participating in the virtual visit: patient, provider  I discussed the limitations of evaluation and management by telemedicine and the availability of in person appointments. The patient expressed understanding and agreed to proceed.   HPI: Russell Beard called with several months history of frequent early morning vomiting.  He states that generally his habit is that he gets up and goes to the bathroom and has a stool and after sensing unpleasant odor this triggers nausea and vomiting.  He is usually well for the rest the day with no recurrence.  He denies any appetite or weight changes.  Has actually had some mild weight gain during the pandemic.  He denies any abdominal pain.  No dysphagia.  No associated headaches.  No stool changes.  He does occasionally use marijuana but infrequently and episodes of vomiting are not related to marijuana use.  He does not generally have active GERD symptoms.  No hoarseness.  No pain with swallowing.   ROS: See pertinent positives and negatives per HPI.  Past Medical History:  Diagnosis Date  . ADHD (attention deficit hyperactivity disorder)   . Autism     Past Surgical History:  Procedure Laterality Date  . WISDOM TOOTH EXTRACTION      Family History  Problem Relation Age of Onset  . Kidney Stones Mother   . Kidney Stones Father   . Diabetes Father     SOCIAL HX: No nicotine use.   Occasional marijuana use.   Current Outpatient Medications:  .  amphetamine-dextroamphetamine (ADDERALL XR) 30 MG 24 hr capsule, Take by mouth daily., Disp: , Rfl: 0 .  ARIPiprazole (ABILIFY) 20 MG tablet, Take 20 mg by mouth every morning., Disp: , Rfl: 1 .  cloNIDine (CATAPRES) 0.2 MG tablet, Take 0.2 mg by mouth at bedtime., Disp: , Rfl: 2 .  cloNIDine (CATAPRES) 0.3 MG tablet, Take 0.3 mg by mouth daily., Disp: , Rfl:  .  gabapentin (NEURONTIN) 400 MG capsule, Take 400 mg by mouth as needed. , Disp: , Rfl: 2 .  oseltamivir (TAMIFLU) 75 MG capsule, , Disp: , Rfl:  .  triamcinolone cream (KENALOG) 0.1 %, Apply 1 application topically 2 (two) times daily as needed., Disp: 30 g, Rfl: 1  EXAM:  VITALS per patient if applicable:  GENERAL: alert, oriented, appears well and in no acute distress  HEENT: atraumatic, conjunttiva clear, no obvious abnormalities on inspection of external nose and ears  NECK: normal movements of the head and neck  LUNGS: on inspection no signs of respiratory distress, breathing rate appears normal, no obvious gross SOB, gasping or wheezing  CV: no obvious cyanosis  MS: moves all visible extremities without noticeable abnormality  PSYCH/NEURO: pleasant and cooperative, no obvious depression or anxiety, speech and thought processing grossly intact  ASSESSMENT AND PLAN:  Discussed the following assessment and plan:  Non-intractable vomiting with nausea, unspecified vomiting type-this sounds more like a functional type vomiting.  He has not had any red  flags such as dysphagia, loss of appetite, or weight loss.  -We recommend trial of Pepcid 20 mg once or twice daily -We suggest that he try some things to reduce the odor from his bowel movements which seems to be the consistent trigger for his vomiting episodes.  He seems to have a heightened sensitivity to smell -Touch base in 2 weeks if not improving with the above     I discussed the assessment and  treatment plan with the patient. The patient was provided an opportunity to ask questions and all were answered. The patient agreed with the plan and demonstrated an understanding of the instructions.   The patient was advised to call back or seek an in-person evaluation if the symptoms worsen or if the condition fails to improve as anticipated.     Carolann Littler, MD

## 2020-03-06 ENCOUNTER — Encounter: Payer: Self-pay | Admitting: Family Medicine

## 2020-03-06 ENCOUNTER — Other Ambulatory Visit: Payer: Self-pay

## 2020-03-06 ENCOUNTER — Ambulatory Visit: Payer: BC Managed Care – PPO | Admitting: Family Medicine

## 2020-03-06 VITALS — BP 126/77 | HR 75 | Ht 73.5 in | Wt 215.0 lb

## 2020-03-06 DIAGNOSIS — L309 Dermatitis, unspecified: Secondary | ICD-10-CM | POA: Diagnosis not present

## 2020-03-06 DIAGNOSIS — R1110 Vomiting, unspecified: Secondary | ICD-10-CM

## 2020-03-06 MED ORDER — TRIAMCINOLONE ACETONIDE 0.1 % EX CREA: 1.0000 "application " | TOPICAL_CREAM | Freq: Two times a day (BID) | CUTANEOUS | 1 refills | Status: AC

## 2020-03-06 NOTE — Progress Notes (Signed)
Established Patient Office Visit  Subjective:  Patient ID: Russell Akel., male    DOB: 09/04/2000  Age: 19 y.o. MRN: [id number]  CC: No chief complaint on file.   HPI Russell Beard. presents for episode of vomiting Sunday night.  He states he ate at [facility] and shortly afterwards had vomiting.  He noted a little bit of bright blood when he was vomiting but none since then.  No melena.  No abdominal pain.  No pain with swallowing.  He has a very long history of low threshold for vomiting in the past.  Refer to virtual visit from 07/24/2019 for details.  Last spring he was specially having early morning vomiting frequently.  He states he has had years of this kind of recurrent vomiting and he thinks a lot of this is stress related.  He has not had any dysphagia, abdominal pain, stool changes, appetite changes, weight loss, or any early satiety.  No known food allergies  He is followed by psychiatry and treated for ADD, stuttering, Asperger's syndrome.  His current medications include Jornay-which is a methylphenidate product, clonidine, Celexa.  Past Medical History:  Diagnosis Date  . ADHD (attention deficit hyperactivity disorder)   . Autism     Past Surgical History:  Procedure Laterality Date  . WISDOM TOOTH EXTRACTION      Family History  Problem Relation Age of Onset  . Kidney Stones Mother   . Kidney Stones Father   . Diabetes Father     Social History   Socioeconomic History  . Marital status: Single    Spouse name: Not on file  . Number of children: Not on file  . Years of education: Not on file  . Highest education level: Not on file  Occupational History  . Not on file  Tobacco Use  . Smoking status: Never Smoker  . Smokeless tobacco: Never Used  Vaping Use  . Vaping Use: Never used  Substance and Sexual Activity  . Alcohol use: No  . Drug use: No  . Sexual activity: Never    Birth control/protection: Abstinence  Other Topics Concern  .  Not on file  Social History Narrative  . Not on file   Social Determinants of Health   Financial Resource Strain:   . Difficulty of Paying Living Expenses: Not on file  Food Insecurity:   . Worried About Programme researcher, broadcasting/film/video in the Last Year: Not on file  . Ran Out of Food in the Last Year: Not on file  Transportation Needs:   . Lack of Transportation (Medical): Not on file  . Lack of Transportation (Non-Medical): Not on file  Physical Activity:   . Days of Exercise per Week: Not on file  . Minutes of Exercise per Session: Not on file  Stress:   . Feeling of Stress : Not on file  Social Connections:   . Frequency of Communication with Friends and Family: Not on file  . Frequency of Social Gatherings with Friends and Family: Not on file  . Attends Religious Services: Not on file  . Active Member of Clubs or Organizations: Not on file  . Attends Banker Meetings: Not on file  . Marital Status: Not on file  Intimate Partner Violence:   . Fear of Current or Ex-Partner: Not on file  . Emotionally Abused: Not on file  . Physically Abused: Not on file  . Sexually Abused: Not on file    Outpatient Medications  Prior to Visit  Medication Sig Dispense Refill  . amphetamine-dextroamphetamine (ADDERALL) 20 MG tablet Take 20 mg by mouth daily.    . ARIPiprazole (ABILIFY) 15 MG tablet Take 15 mg by mouth daily.    . citalopram (CELEXA) 10 MG tablet Take by mouth daily.    . cloNIDine (CATAPRES) 0.2 MG tablet Take 0.2 mg by mouth at bedtime.  2  . JORNAY PM 80 MG CP24 Take 1 capsule by mouth at bedtime.    Marland Kitchen amphetamine-dextroamphetamine (ADDERALL XR) 30 MG 24 hr capsule Take by mouth daily.  0  . ARIPiprazole (ABILIFY) 20 MG tablet Take 20 mg by mouth every morning.  1  . cloNIDine (CATAPRES) 0.3 MG tablet Take 0.3 mg by mouth daily.    Marland Kitchen gabapentin (NEURONTIN) 400 MG capsule Take 400 mg by mouth as needed.   2  . oseltamivir (TAMIFLU) 75 MG capsule     . triamcinolone cream  (KENALOG) 0.1 % Apply 1 application topically 2 (two) times daily as needed. 30 g 1   No facility-administered medications prior to visit.    Allergies  Allergen Reactions  . Nickel Rash  . Other     Cayenne pepper causes diarrhea    ROS Review of Systems  Constitutional: Negative for appetite change, chills, fever and unexpected weight change.  Respiratory: Negative for cough and shortness of breath.   Cardiovascular: Negative for chest pain.  Gastrointestinal: Negative for abdominal distention, abdominal pain, blood in stool, diarrhea and rectal pain.       See HPI  Genitourinary: Negative for dysuria.  Skin: Positive for rash.  Neurological: Negative for dizziness.  Hematological: Negative for adenopathy.      Objective:    Physical Exam Vitals reviewed.  Constitutional:      Appearance: Normal appearance.  Cardiovascular:     Rate and Rhythm: Normal rate and regular rhythm.  Pulmonary:     Effort: Pulmonary effort is normal.     Breath sounds: Normal breath sounds.  Abdominal:     General: There is no distension.     Palpations: Abdomen is soft. There is no mass.     Tenderness: There is no abdominal tenderness. There is no guarding or rebound.  Skin:    Findings: Rash present.     Comments: Small area of scaly rash just superior and posterior to the left ear.  He is noted to have little bit of flaky rash in eyebrow region bilaterally as well  Neurological:     Mental Status: He is alert.     BP 126/77   Pulse 75   Ht 6' 1.5" (1.867 m)   Wt 215 lb (97.5 kg)   BMI 27.98 kg/m  Wt Readings from Last 3 Encounters:  03/06/20 215 lb (97.5 kg)  05/16/18 200 lb (90.7 kg)  05/02/18 202 lb 8 oz (91.9 kg)     Health Maintenance Due  Topic Date Due  . Hepatitis C Screening  Never done  . HIV Screening  Never done  . INFLUENZA VACCINE  11/19/2019    There are no preventive care reminders to display for this patient.  No results found for: TSH Lab Results   Component Value Date   WBC 6.2 04/23/2018   PLT 297 04/23/2018   Lab Results  Component Value Date   NA 141 04/23/2018   K 4.8 04/23/2018   BUN 18 04/23/2018   CREATININE 1.0 04/23/2018   ALKPHOS 118 04/23/2018   AST 22 04/23/2018  ALT 29 04/23/2018   No results found for: CHOL Lab Results  Component Value Date   HDL 48 04/23/2018   Lab Results  Component Value Date   LDLCALC 14 04/23/2018   No results found for: TRIG No results found for: [facility] Lab Results  Component Value Date   HGBA1C 5.4 04/23/2018      Assessment & Plan:   #1 longstanding history of intermittent vomiting.  He states this has been for years and generally stress related.  He does not have any red flags such as abdominal pain, appetite change, weight loss, melena, dysphagia, etc.  -Check further labs with CBC, comprehensive metabolic panel, lipase -Consider GI referral for any progressive symptoms.  His symptoms seem to be very intermittent.  #2 eczema type rash behind the left ear.  -Recommend triamcinolone 0.1% cream twice daily as needed. -We explained discount of rash can be treated for exacerbations but not cured.  Meds ordered this encounter  Medications  . triamcinolone (KENALOG) 0.1 %    Sig: Apply 1 application topically 2 (two) times daily.    Dispense:  30 g    Refill:  1    Follow-up: No follow-ups on file.    Evelena Peat, MD

## 2020-03-07 LAB — CBC WITH DIFFERENTIAL/PLATELET
Absolute Monocytes: 752 cells/uL (ref 200–950)
Basophils Absolute: 29 cells/uL (ref 0–200)
Basophils Relative: 0.4 %
Eosinophils Absolute: 314 cells/uL (ref 15–500)
Eosinophils Relative: 4.3 %
HCT: 41.8 % (ref 38.5–50.0)
Hemoglobin: 14 g/dL (ref 13.2–17.1)
Lymphs Abs: 1891 cells/uL (ref 850–3900)
MCH: 30 pg (ref 27.0–33.0)
MCHC: 33.5 g/dL (ref 32.0–36.0)
MCV: 89.7 fL (ref 80.0–100.0)
MPV: 10.7 fL (ref 7.5–12.5)
Monocytes Relative: 10.3 %
Neutro Abs: 4314 cells/uL (ref 1500–7800)
Neutrophils Relative %: 59.1 %
Platelets: 285 10*3/uL (ref 140–400)
RBC: 4.66 10*6/uL (ref 4.20–5.80)
RDW: 12.4 % (ref 11.0–15.0)
Total Lymphocyte: 25.9 %
WBC: 7.3 10*3/uL (ref 3.8–10.8)

## 2020-03-07 LAB — COMPREHENSIVE METABOLIC PANEL
AG Ratio: 1.8 (calc) (ref 1.0–2.5)
ALT: 33 U/L (ref 9–46)
AST: 24 U/L (ref 10–40)
Albumin: 4.2 g/dL (ref 3.6–5.1)
Alkaline phosphatase (APISO): 114 U/L (ref 36–130)
BUN: 19 mg/dL (ref 7–25)
CO2: 26 mmol/L (ref 20–32)
Calcium: 10 mg/dL (ref 8.6–10.3)
Chloride: 106 mmol/L (ref 98–110)
Creat: 0.96 mg/dL (ref 0.60–1.35)
Globulin: 2.3 g/dL (calc) (ref 1.9–3.7)
Glucose, Bld: 84 mg/dL (ref 65–99)
Potassium: 4.5 mmol/L (ref 3.5–5.3)
Sodium: 142 mmol/L (ref 135–146)
Total Bilirubin: 0.2 mg/dL (ref 0.2–1.2)
Total Protein: 6.5 g/dL (ref 6.1–8.1)

## 2020-03-07 LAB — LIPASE: Lipase: 12 U/L (ref 7–60)

## 2020-03-08 NOTE — Progress Notes (Signed)
Labs all normal.   No concerns.

## 2020-10-15 ENCOUNTER — Ambulatory Visit (INDEPENDENT_AMBULATORY_CARE_PROVIDER_SITE_OTHER): Payer: BC Managed Care – PPO | Admitting: Family Medicine

## 2020-10-15 ENCOUNTER — Other Ambulatory Visit: Payer: Self-pay

## 2020-10-15 VITALS — BP 118/84 | HR 103 | Temp 98.1°F | Wt 217.3 lb

## 2020-10-15 DIAGNOSIS — G2401 Drug induced subacute dyskinesia: Secondary | ICD-10-CM | POA: Diagnosis not present

## 2020-10-15 NOTE — Patient Instructions (Signed)
Follow up with psychiatry to discuss symptoms with them  Suspect involuntary symptoms are related to the Abilify.  We could consult with neurology if symptoms persist.

## 2020-10-15 NOTE — Progress Notes (Signed)
Established Patient Office Visit  Subjective:  Patient ID: Russell Beard., male    DOB: [birth year]  Age: 20 y.o. MRN: [id number]  CC:  Chief Complaint  Patient presents with   Spasms    X 2 years, sides and stomach, gets worse in high stress situations,     HPI Russell Beard. presents for involuntary motor movements which he states he had since release 11.  He has history of autism and is followed by psychiatry and treated with multiple medications including Abilify, Celexa, clonidine, Adderall, methylphenidate.  He states that he has abnormal movements particularly involving his trunk (trunk writhing type movements).  He describes these as "spasms ".  In high stress situations particular he notices that he has involuntary type contractions where his trunk will been toward one side.  He does recall in the past having involuntary twitches of his eyes.  Current symptoms seem to be confined to his trunk.  He is noted incidentally that CBD oil seems to help.  He has not discussed these with his psychiatrist.  He apparently been on Abilify for quite some time.  Past Medical History:  Diagnosis Date   ADHD (attention deficit hyperactivity disorder)    Autism     Past Surgical History:  Procedure Laterality Date   WISDOM TOOTH EXTRACTION      Family History  Problem Relation Age of Onset   Kidney Stones Mother    Kidney Stones Father    Diabetes Father     Social History   Socioeconomic History   Marital status: Single    Spouse name: Not on file   Number of children: Not on file   Years of education: Not on file   Highest education level: Not on file  Occupational History   Not on file  Tobacco Use   Smoking status: Never   Smokeless tobacco: Never  Vaping Use   Vaping Use: Never used  Substance and Sexual Activity   Alcohol use: No   Drug use: No   Sexual activity: Never    Birth control/protection: Abstinence  Other Topics Concern   Not on file   Social History Narrative   Not on file   Social Determinants of Health   Financial Resource Strain: Not on file  Food Insecurity: Not on file  Transportation Needs: Not on file  Physical Activity: Not on file  Stress: Not on file  Social Connections: Not on file  Intimate Partner Violence: Not on file    Outpatient Medications Prior to Visit  Medication Sig Dispense Refill   amphetamine-dextroamphetamine (ADDERALL) 20 MG tablet Take 20 mg by mouth daily.     ARIPiprazole (ABILIFY) 10 MG tablet Take 10 mg by mouth daily.     citalopram (CELEXA) 10 MG tablet Take by mouth daily.     cloNIDine (CATAPRES) 0.2 MG tablet Take 0.2 mg by mouth at bedtime.  2   JORNAY PM 80 MG CP24 Take 1 capsule by mouth at bedtime.     triamcinolone (KENALOG) 0.1 % Apply 1 application topically 2 (two) times daily. 30 g 1   ARIPiprazole (ABILIFY) 15 MG tablet Take 15 mg by mouth daily.     No facility-administered medications prior to visit.    Allergies  Allergen Reactions   Nickel Rash   Other     Cayenne pepper causes diarrhea    ROS Review of Systems  Constitutional:  Negative for fatigue.  Eyes:  Negative for visual disturbance.  Respiratory:  Negative for cough, chest tightness and shortness of breath.   Cardiovascular:  Negative for chest pain, palpitations and leg swelling.  Neurological:  Negative for dizziness, syncope, weakness, light-headedness and headaches.     Objective:    Physical Exam Constitutional:      Appearance: Normal appearance.  Cardiovascular:     Rate and Rhythm: Normal rate and regular rhythm.  Pulmonary:     Effort: Pulmonary effort is normal.     Breath sounds: Normal breath sounds.  Musculoskeletal:     Cervical back: Neck supple.  Neurological:     Mental Status: He is alert.     Motor: No weakness.     Comments: He has abnormal dystonic movements periodically involving his trunk.  These are usually very transient lasting just couple seconds.     BP 118/84 (BP Location: Left Arm, Cuff Size: Normal)   Pulse (!) 103   Temp 98.1 F (36.7 C) (Oral)   Wt 217 lb 4.8 oz (98.6 kg)   SpO2 96%   BMI 28.28 kg/m  Wt Readings from Last 3 Encounters:  10/15/20 217 lb 4.8 oz (98.6 kg)  03/06/20 215 lb (97.5 kg)  05/16/18 200 lb (90.7 kg)     Health Maintenance Due  Topic Date Due   HPV VACCINES (1 - Male 2-dose series) Never done   HIV Screening  Never done   Hepatitis C Screening  Never done   COVID-19 Vaccine (2 - Moderna series) 03/30/2020       Topic Date Due   HPV VACCINES (1 - Male 2-dose series) Never done    No results found for: TSH Lab Results  Component Value Date   WBC 7.3 03/06/2020   HGB 14.0 03/06/2020   HCT 41.8 03/06/2020   MCV 89.7 03/06/2020   PLT 285 03/06/2020   Lab Results  Component Value Date   NA 142 03/06/2020   K 4.5 03/06/2020   CO2 26 03/06/2020   GLUCOSE 84 03/06/2020   BUN 19 03/06/2020   CREATININE 0.96 03/06/2020   BILITOT 0.2 03/06/2020   ALKPHOS 118 04/23/2018   AST 24 03/06/2020   ALT 33 03/06/2020   PROT 6.5 03/06/2020   CALCIUM 10.0 03/06/2020   No results found for: CHOL Lab Results  Component Value Date   HDL 48 04/23/2018   Lab Results  Component Value Date   LDLCALC 14 04/23/2018   No results found for: TRIG No results found for: CHOLHDL Lab Results  Component Value Date   HGBA1C 5.4 04/23/2018      Assessment & Plan:   Patient relates several year history of dystonic type reaction mostly involving his trunk.  Suspect related to Abilify.  Can also see things like Tourette's syndrome with methylphenidate and Adderall but he is describing more tardive dyskinesia type activity.  Apparently can see increased incidence with combination therapy with methylphenidate and second-generation antipsychotics  -We recommend he consult with his psychiatrist first.  We did discuss possible neurology referral but he needs to discuss symptoms above with psychiatrist since  these are very likely related to his Abilify.  They may need to look at alternatives to Abilify    Follow-up: No follow-ups on file.    Evelena Peat, MD

## 2021-02-05 ENCOUNTER — Ambulatory Visit (INDEPENDENT_AMBULATORY_CARE_PROVIDER_SITE_OTHER): Payer: BC Managed Care – PPO | Admitting: Family Medicine

## 2021-02-05 ENCOUNTER — Other Ambulatory Visit: Payer: Self-pay

## 2021-02-05 VITALS — BP 122/64 | HR 72 | Temp 97.7°F | Wt 202.9 lb

## 2021-02-05 DIAGNOSIS — H6123 Impacted cerumen, bilateral: Secondary | ICD-10-CM

## 2021-02-05 DIAGNOSIS — K219 Gastro-esophageal reflux disease without esophagitis: Secondary | ICD-10-CM

## 2021-02-05 NOTE — Patient Instructions (Signed)
Consider trial of over the counter Pepcid 20 mg twice daily.   Avoid foods that can trigger GERD    Limit caffeine intake.

## 2021-02-05 NOTE — Progress Notes (Signed)
Established Patient Office Visit  Subjective:  Patient ID: Russell Valente., male    DOB: [birth year]  Age: 20 y.o. MRN: [id number]  CC:  Chief Complaint  Patient presents with   Hearing Problem    X 1 week    HPI Russell Beard. presents for the following concerns  Muffled hearing bilaterally past few weeks and especially during the past week.  Similar symptoms in the past with cerumen impaction.  No dizziness.  No ear drainage.  GERD symptoms.  Worsening recently.  Substernal burning sensation especially with citric foods such as tomato.  No dysphagia.  Good appetite.  No abdominal pain.  He does sometimes have frequent bowel movements up to 2-3 a day and occasionally loose but not consistently.  No bloody stools.  No nausea or vomiting.  No history of known lactose intolerance or gluten sensitivity  Past Medical History:  Diagnosis Date   ADHD (attention deficit hyperactivity disorder)    Autism     Past Surgical History:  Procedure Laterality Date   WISDOM TOOTH EXTRACTION      Family History  Problem Relation Age of Onset   Kidney Stones Mother    Kidney Stones Father    Diabetes Father     Social History   Socioeconomic History   Marital status: Single    Spouse name: Not on file   Number of children: Not on file   Years of education: Not on file   Highest education level: Not on file  Occupational History   Not on file  Tobacco Use   Smoking status: Never   Smokeless tobacco: Never  Vaping Use   Vaping Use: Never used  Substance and Sexual Activity   Alcohol use: No   Drug use: No   Sexual activity: Never    Birth control/protection: Abstinence  Other Topics Concern   Not on file  Social History Narrative   Not on file   Social Determinants of Health   Financial Resource Strain: Not on file  Food Insecurity: Not on file  Transportation Needs: Not on file  Physical Activity: Not on file  Stress: Not on file  Social Connections:  Not on file  Intimate Partner Violence: Not on file    Outpatient Medications Prior to Visit  Medication Sig Dispense Refill   amphetamine-dextroamphetamine (ADDERALL) 20 MG tablet Take 20 mg by mouth daily.     ARIPiprazole (ABILIFY) 10 MG tablet Take 10 mg by mouth daily.     citalopram (CELEXA) 10 MG tablet Take by mouth daily.     cloNIDine (CATAPRES) 0.2 MG tablet Take 0.2 mg by mouth at bedtime.  2   JORNAY PM 80 MG CP24 Take 1 capsule by mouth at bedtime.     triamcinolone (KENALOG) 0.1 % Apply 1 application topically 2 (two) times daily. 30 g 1   No facility-administered medications prior to visit.    Allergies  Allergen Reactions   Nickel Rash   Other     Cayenne pepper causes diarrhea    ROS Review of Systems  Constitutional:  Negative for chills and fever.  HENT:  Positive for hearing loss. Negative for ear discharge, ear pain and trouble swallowing.   Respiratory:  Negative for cough and shortness of breath.   Gastrointestinal:  Negative for abdominal pain.     Objective:    Physical Exam Vitals reviewed.  Constitutional:      Appearance: Normal appearance.  HENT:  Ears:     Comments: Cerumen impaction both canals.  Ears reexamined after irrigation and wax completely removed.  He has some mild erythema of the external canal but eardrums appear normal.  No bleeding. Cardiovascular:     Rate and Rhythm: Normal rate and regular rhythm.  Pulmonary:     Effort: Pulmonary effort is normal.     Breath sounds: Normal breath sounds.  Neurological:     Mental Status: He is alert.    BP 122/64 (BP Location: Left Arm, Patient Position: Sitting, Cuff Size: Normal)   Pulse 72   Temp 97.7 F (36.5 C) (Oral)   Wt 202 lb 14.4 oz (92 kg)   SpO2 99%   BMI 26.41 kg/m  Wt Readings from Last 3 Encounters:  02/05/21 202 lb 14.4 oz (92 kg)  10/15/20 217 lb 4.8 oz (98.6 kg)  03/06/20 215 lb (97.5 kg)     Health Maintenance Due  Topic Date Due   HPV VACCINES (1  - Male 2-dose series) Never done   HIV Screening  Never done   Hepatitis C Screening  Never done   COVID-19 Vaccine (2 - Moderna series) 03/30/2020   INFLUENZA VACCINE  11/18/2020       Topic Date Due   HPV VACCINES (1 - Male 2-dose series) Never done    No results found for: TSH Lab Results  Component Value Date   WBC 7.3 03/06/2020   HGB 14.0 03/06/2020   HCT 41.8 03/06/2020   MCV 89.7 03/06/2020   PLT 285 03/06/2020   Lab Results  Component Value Date   NA 142 03/06/2020   K 4.5 03/06/2020   CO2 26 03/06/2020   GLUCOSE 84 03/06/2020   BUN 19 03/06/2020   CREATININE 0.96 03/06/2020   BILITOT 0.2 03/06/2020   ALKPHOS 118 04/23/2018   AST 24 03/06/2020   ALT 33 03/06/2020   PROT 6.5 03/06/2020   CALCIUM 10.0 03/06/2020   No results found for: CHOL Lab Results  Component Value Date   HDL 48 04/23/2018   Lab Results  Component Value Date   LDLCALC 14 04/23/2018   No results found for: TRIG No results found for: [facility] Lab Results  Component Value Date   HGBA1C 5.4 04/23/2018      Assessment & Plan:   #1 bilateral cerumen impaction.  Patient aware of risk of irrigation including low risk of perforation, pain, bleeding.  He consented.  Both ear canals irrigated per nurse as above with removal of cerumen and he tolerated well Hearing improved afterwards.  #2 increased GERD symptoms.  Does not have any red flags such as appetite change, weight loss, dysphagia.  We discussed dietary modification with handout given.  Suggested he start with over-the-counter medication such as Pepcid 20 mg twice daily.  Be in touch if this is not controlling his symptoms adequately.  No orders of the defined types were placed in this encounter.   Follow-up: No follow-ups on file.    Evelena Peat, MD

## 2021-06-23 ENCOUNTER — Telehealth: Payer: Self-pay | Admitting: Family Medicine

## 2021-06-23 DIAGNOSIS — R1110 Vomiting, unspecified: Secondary | ICD-10-CM

## 2021-06-23 NOTE — Telephone Encounter (Signed)
Patient's stepmother called because patient is still experiencing vomiting and diarrhea after eating. Stepmother states that patient is stuck in bathroom for a while after eating due to this and is in pain. Patient would like a referral to GI and would prefer a male specialist. ? ? ? ? ?Please advise  ? ?  ?

## 2021-06-23 NOTE — Telephone Encounter (Signed)
Referral placed and pt is aware. 

## 2021-09-11 ENCOUNTER — Encounter: Payer: Self-pay | Admitting: Gastroenterology

## 2021-09-17 ENCOUNTER — Encounter: Payer: Self-pay | Admitting: Gastroenterology

## 2021-09-17 ENCOUNTER — Other Ambulatory Visit (INDEPENDENT_AMBULATORY_CARE_PROVIDER_SITE_OTHER): Payer: BC Managed Care – PPO

## 2021-09-17 ENCOUNTER — Ambulatory Visit (INDEPENDENT_AMBULATORY_CARE_PROVIDER_SITE_OTHER): Payer: BC Managed Care – PPO | Admitting: Gastroenterology

## 2021-09-17 VITALS — BP 124/76 | HR 83 | Ht 74.0 in | Wt 193.1 lb

## 2021-09-17 DIAGNOSIS — R1013 Epigastric pain: Secondary | ICD-10-CM | POA: Diagnosis not present

## 2021-09-17 DIAGNOSIS — E639 Nutritional deficiency, unspecified: Secondary | ICD-10-CM

## 2021-09-17 DIAGNOSIS — R634 Abnormal weight loss: Secondary | ICD-10-CM

## 2021-09-17 DIAGNOSIS — R112 Nausea with vomiting, unspecified: Secondary | ICD-10-CM

## 2021-09-17 LAB — COMPREHENSIVE METABOLIC PANEL
ALT: 11 U/L (ref 0–53)
AST: 15 U/L (ref 0–37)
Albumin: 4.6 g/dL (ref 3.5–5.2)
Alkaline Phosphatase: 100 U/L (ref 39–117)
BUN: 17 mg/dL (ref 6–23)
CO2: 29 mEq/L (ref 19–32)
Calcium: 10.2 mg/dL (ref 8.4–10.5)
Chloride: 105 mEq/L (ref 96–112)
Creatinine, Ser: 0.86 mg/dL (ref 0.40–1.50)
GFR: 122.14 mL/min (ref >60.00)
Glucose, Bld: 100 mg/dL — ABNORMAL HIGH (ref 70–99)
Potassium: 4.1 mEq/L (ref 3.5–5.1)
Sodium: 141 mEq/L (ref 135–145)
Total Bilirubin: 0.4 mg/dL (ref 0.2–1.2)
Total Protein: 7.4 g/dL (ref 6.0–8.3)

## 2021-09-17 LAB — VITAMIN B12: Vitamin B-12: 190 pg/mL — ABNORMAL LOW (ref 211–911)

## 2021-09-17 LAB — CBC WITH DIFFERENTIAL/PLATELET
Basophils Absolute: 0 10*3/uL (ref 0.0–0.1)
Basophils Relative: 0.7 % (ref 0.0–3.0)
Eosinophils Absolute: 0.1 10*3/uL (ref 0.0–0.7)
Eosinophils Relative: 2.1 % (ref 0.0–5.0)
HCT: 40.7 % (ref 39.0–52.0)
Hemoglobin: 13.6 g/dL (ref 13.0–17.0)
Lymphocytes Relative: 27.8 % (ref 12.0–46.0)
Lymphs Abs: 1.4 10*3/uL (ref 0.7–4.0)
MCHC: 33.5 g/dL (ref 30.0–36.0)
MCV: 89.6 fl (ref 78.0–100.0)
Monocytes Absolute: 0.5 10*3/uL (ref 0.1–1.0)
Monocytes Relative: 10.1 % (ref 3.0–12.0)
Neutro Abs: 2.9 10*3/uL (ref 1.4–7.7)
Neutrophils Relative %: 59.3 % (ref 43.0–77.0)
Platelets: 251 10*3/uL (ref 150.0–400.0)
RBC: 4.54 Mil/uL (ref 4.22–5.81)
RDW: 13.2 % (ref 11.5–15.5)
WBC: 5 10*3/uL (ref 4.0–10.5)

## 2021-09-17 LAB — IBC + FERRITIN
Ferritin: 30.1 ng/mL (ref 22.0–322.0)
Iron: 61 ug/dL (ref 42–165)
Saturation Ratios: 15.4 % — ABNORMAL LOW (ref 20.0–50.0)
TIBC: 396.2 ug/dL (ref 250.0–450.0)
Transferrin: 283 mg/dL (ref 212.0–360.0)

## 2021-09-17 LAB — FOLATE: Folate: 14.5 ng/mL (ref >5.9)

## 2021-09-17 LAB — VITAMIN D 25 HYDROXY (VIT D DEFICIENCY, FRACTURES): VITD: 29.8 ng/mL — ABNORMAL LOW (ref 30.00–100.00)

## 2021-09-17 NOTE — Patient Instructions (Addendum)
If you are age 23 or younger, your body mass index should be between 19-25. Your Body mass index is 24.8 kg/m. If this is out of the aformentioned range listed, please consider follow up with your Primary Care Provider.   ________________________________________________________  The [facility] providers would like to encourage you to use [facility] to communicate with providers for non-urgent requests or questions.  Due to long hold times on the telephone, sending your provider a message by [facility] may be a faster and more efficient way to get a response.  Please allow 48 business hours for a response.  Please remember that this is for non-urgent requests.  _______________________________________________________  Due to recent changes in healthcare laws, you may see the results of your imaging and laboratory studies on MyChart before your provider has had a chance to review them.  We understand that in some cases there may be results that are confusing or concerning to you. Not all laboratory results come back in the same time frame and the provider may be waiting for multiple results in order to interpret others.  Please give [place] 48 hours in order for your provider to thoroughly review all the results before contacting the office for clarification of your results.   Your provider has requested that you go to the basement level for lab work before leaving today. Press "B" on the elevator. The lab is located at the first door on the left as you exit the elevator.  Please follow up in 3 months. Give [place] a call at [phone] to schedule an appointment.   Thank you for choosing me and [facility].  Vito Cirigliano, D.O.

## 2021-09-17 NOTE — Progress Notes (Signed)
Chief Complaint: Nausea/vomiting   Referring Provider:     Kristian Covey, MD   HPI:     Russell Beard. is a 21 y.o. male with a history of anxiety, depression, nephrolithiasis, ADHD, referred to the Gastroenterology Clinic for evaluation of nausea/vomiting.  He reports a longstanding history of postprandial emesis for years. Thinks it is worse during stress. Worse in college.  Will vomit first thing in the a.m. or with increased stress.  Was seeing a counselor at school, but he has since graduated.    He also has difficulty maintaining caloric intake.  Part of this issue is reduced food options on weekends when away at college, but also poor food options currently as he cannot cook at home and no transportation to supermarkets.  Will eat sauce packets when away at college on weekends due to reduced food options on weekends.   GI sxs worse with tomato-based sauces, citric juices.  No HB, dysphagia. Has not trialed any medications for these sxs.   Weight down 24# since 09/2020, but part is also related to weight gain in pandemic.   Has been using CBD for stress.   Reported Hx of Vit D def. and he is concerned about nutritional deficiencies.   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   Personal history of kidney stones    had 1 in 2020 2 in 2022 and 1 at age 87     Past Surgical History:  Procedure Laterality Date   HAND SURGERY Left    had stitches in hand   WISDOM TOOTH EXTRACTION     age 11   Family History  Problem Relation Age of Onset   Kidney Stones Mother    Kidney Stones Father    Diabetes Father    Colon cancer Maternal Grandmother    Cancer Maternal Grandfather        Tonsillectomy   Lung cancer Paternal Grandfather    Liver cancer Paternal Grandfather    Colon cancer Other    Breast cancer Other    Social History    Tobacco Use   Smoking status: Never   Smokeless tobacco: Never  Vaping Use   Vaping Use: Every day   Devices: Does CBD pen  Substance Use Topics   Alcohol use: Yes    Comment: socially mostly wine and beer rarely a mixed drink   Drug use: No   Current Outpatient Medications  Medication Sig Dispense Refill   amphetamine-dextroamphetamine (ADDERALL) 20 MG tablet Take 20 mg by mouth daily as needed.     citalopram (CELEXA) 20 MG tablet Take 40 mg by mouth at bedtime.     cloNIDine (CATAPRES) 0.2 MG tablet Take 0.2 mg by mouth at bedtime.  2   JORNAY PM 80 MG CP24 Take 1 capsule by mouth at bedtime.     triamcinolone (KENALOG) 0.1 % Apply 1 application topically 2 (two) times daily. (Patient taking differently: Apply 1 application. topically as needed.) 30 g 1   No current facility-administered medications for this visit.   Allergies  Allergen Reactions   Nickel Rash     Review of Systems: All systems reviewed and negative except where noted in HPI.     Physical Exam:    Wt Readings from Last  3 Encounters:  09/17/21 193 lb 2 oz (87.6 kg)  02/05/21 202 lb 14.4 oz (92 kg)  10/15/20 217 lb 4.8 oz (98.6 kg)    BP 124/76   Pulse 83   Ht 6\' 2"  (1.88 m)   Wt 193 lb 2 oz (87.6 kg)   BMI 24.80 kg/m  Constitutional:  Pleasant, in no acute distress. Psychiatric: Normal mood and affect. Behavior is normal. Cardiovascular: Normal rate, regular rhythm. No edema Pulmonary/chest: Effort normal and breath sounds normal. No wheezing, rales or rhonchi. Abdominal: Soft, nondistended, nontender. Bowel sounds active throughout. There are no masses palpable. No hepatomegaly. Neurological: Alert and oriented to person place and time. Skin: Skin is warm and dry. No rashes noted.   ASSESSMENT AND PLAN;   1) Nausea/Vomiting - Discussed the broad DDx for nausea/vomiting, particularly related to stress.  While he does have weight loss over the last year, much of that is also due to poor  caloric intake, poor nutrition, and also weight seemingly returning to baseline after weight gain during the pandemic.  Discussed the role/utility of upper endoscopy and cross-sectional imaging, and would like to manage conservatively to start - If continued symptomatology, plan for upper endoscopy +/- CT  2) Weight loss 3) Poor nutrition 4) Dyspepsia - Check CBC, BMP, Vit D, B12, folate, iron panel - Briefly discussed possibly involving Nutrition Clinic   RTC in 3-4 months or sooner as needed    , DO, FACG  09/17/2021, 10:20 AM   Burchette, 09/19/2021, MD

## 2021-09-19 ENCOUNTER — Other Ambulatory Visit: Payer: Self-pay

## 2021-09-19 MED ORDER — CYANOCOBALAMIN 1000 MCG/ML IJ SOLN: INTRAMUSCULAR | 0 refills | Status: DC

## 2021-09-19 MED ORDER — VITAMIN D (ERGOCALCIFEROL) 1.25 MG (50000 UNIT) PO CAPS
50000.0000 [IU] | ORAL_CAPSULE | ORAL | 0 refills | Status: DC
Start: 2021-09-19 — End: 2021-11-10

## 2021-09-25 ENCOUNTER — Telehealth: Payer: Self-pay

## 2021-09-25 NOTE — Telephone Encounter (Signed)
--  Caller states that he was bitten by a baby raccoon that someone is fostering to his finger. He did not break the skin, but there was a very tiny amt of saliva.  09/25/2021 11:40:12 AM [place], RN, [facility] Advice Given Per Guideline HOME CARE: * You should be able to treat this at home. REASSURANCE AND EDUCATION - NO BREAK IN SKIN (NO WOUND): * If it didn't break the skin, it can't become infected. TREATING BRUISES: * COLD PACK FOR FIRST 48 HOURS: For bruises or swelling, apply a cold pack or an ice bag (wrapped in a moist towel) to the area for 20 minutes. Repeat in 1 hour, then as needed for the first 48 hours after the injury. Reason: to reduce the bruising, swelling, and pain. REPORTING WILD ANIMALS AND STRAYS: * [place]: You can report the animal to the animal control center for your county. CALL BACK IF: * You become worse CARE ADVICE given per Animal Bite (Adult) guideline.

## 2021-11-07 ENCOUNTER — Encounter: Payer: Self-pay | Admitting: Family Medicine

## 2021-11-07 ENCOUNTER — Ambulatory Visit: Payer: BC Managed Care – PPO | Admitting: Family Medicine

## 2021-11-07 VITALS — BP 110/70 | HR 64 | Temp 98.4°F | Ht 74.0 in | Wt 198.1 lb

## 2021-11-07 DIAGNOSIS — R300 Dysuria: Secondary | ICD-10-CM

## 2021-11-07 LAB — POC URINALSYSI DIPSTICK (AUTOMATED)
Bilirubin, UA: NEGATIVE
Glucose, UA: NEGATIVE
Ketones, UA: NEGATIVE
Leukocytes, UA: NEGATIVE
Nitrite, UA: NEGATIVE
Protein, UA: NEGATIVE
Spec Grav, UA: >=1.03 — AB (ref 1.010–1.025)
Urobilinogen, UA: 0.2 E.U./dL
pH, UA: 6 (ref 5.0–8.0)

## 2021-11-07 NOTE — Progress Notes (Signed)
Established Patient Office Visit  Subjective   Patient ID: Field Staniszewski., male    DOB: April 22, 2000  Age: 21 y.o. MRN: [id number]  Chief Complaint  Patient presents with   Urinary Tract Infection    X1 day   Penile Discharge    HPI   Mr. Yom is seen with some mild burning with urination past couple days.  He had some fleeting mild recent right flank pain and wondered if he had repeat kidney stone.  He has had stones in the past.  He had imaging 2019 which showed tiny bilateral renal calculi.  He states that his burning with urination has actually improved today.  No fevers or chills.  No gross hematuria.  He does have steady girlfriend and is sexually active but has had same partner for over a year and she has no history of any other partners.  He denies any penile discharge.  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 He has never used smokeless tobacco. He reports current alcohol use. He reports that he does not use drugs. family history includes Breast cancer in an other family member; Cancer in his maternal grandfather; Colon cancer in his maternal grandmother and another family member; Diabetes in his father; Kidney Stones in his father and mother; Liver cancer in his paternal grandfather; Lung cancer in his paternal grandfather. Allergies  Allergen Reactions   Nickel Rash    Review of Systems  Constitutional:  Negative for chills and fever.  Gastrointestinal:  Negative for abdominal pain.  Genitourinary:  Positive for dysuria. Negative for hematuria.      Objective:     BP 110/70 (BP Location: Left  Arm, Patient Position: Sitting, Cuff Size: Normal)   Pulse 64   Temp 98.4 F (36.9 C) (Oral)   Ht 6\' 2"  (1.88 m)   Wt 198 lb 1.6 oz (89.9 kg)   SpO2 95%   BMI 25.43 kg/m    Physical Exam Vitals reviewed.  Constitutional:      Appearance: Normal appearance.  Cardiovascular:     Rate and Rhythm: Normal rate and regular rhythm.  Pulmonary:     Effort: Pulmonary effort is normal.     Breath sounds: Normal breath sounds.  Abdominal:     Palpations: Abdomen is soft.     Tenderness: There is no abdominal tenderness. There is no guarding or rebound.  Neurological:     Mental Status: He is alert.      Results for orders placed or performed in visit on 11/07/21  POCT Urinalysis Dipstick (Automated)  Result Value Ref Range   Color, UA yellow    Clarity, UA cloudy    Glucose, UA Negative Negative   Bilirubin, UA negative    Ketones, UA negative    Spec Grav, UA >=1.030 (A) 1.010 - 1.025   Blood, UA 1+    pH, UA 6.0 5.0 - 8.0   Protein, UA Negative Negative   Urobilinogen, UA 0.2 0.2 or  1.0 E.U./dL   Nitrite, UA negative    Leukocytes, UA Negative Negative      The ASCVD Risk score (Arnett DK, et al., 2019) failed to calculate for the following reasons:   The 2019 ASCVD risk score is only valid for ages 53 to 30    Assessment & Plan:   Problem List Items Addressed This Visit   None Visit Diagnoses     Dysuria    -  Primary   Relevant Orders   POCT Urinalysis Dipstick (Automated) (Completed)     Patient has 1+ blood but no leukocytes and no nitrites.  Suspect he probably passed small stone with recent right flank pain and mild burning with urination which is improving through the day. -Stressed importance of good hydration -Recheck urinalysis couple weeks to make sure hematuria clearing -We did discuss chlamydia and GC screening but he declines.  He states he has had 1 partner during the past year and he had recent negative screening.  Return in about 2 weeks  (around 11/21/2021).    Evelena Peat, MD

## 2021-11-07 NOTE — Patient Instructions (Signed)
Drink plenty of fluids  Follow up in two weeks for repeat urine  Watch for any fever or persistent burning

## 2021-11-08 ENCOUNTER — Other Ambulatory Visit: Payer: Self-pay | Admitting: Gastroenterology

## 2021-11-20 ENCOUNTER — Encounter: Payer: Self-pay | Admitting: Internal Medicine

## 2021-11-20 ENCOUNTER — Ambulatory Visit (INDEPENDENT_AMBULATORY_CARE_PROVIDER_SITE_OTHER): Payer: BC Managed Care – PPO | Admitting: Internal Medicine

## 2021-11-20 ENCOUNTER — Encounter: Payer: Self-pay | Admitting: Family Medicine

## 2021-11-20 VITALS — BP 118/84 | HR 76 | Temp 98.0°F | Ht 74.0 in | Wt 199.0 lb

## 2021-11-20 DIAGNOSIS — J029 Acute pharyngitis, unspecified: Secondary | ICD-10-CM | POA: Diagnosis not present

## 2021-11-20 DIAGNOSIS — J069 Acute upper respiratory infection, unspecified: Secondary | ICD-10-CM

## 2021-11-20 LAB — POCT RAPID STREP A (OFFICE): Rapid Strep A Screen: NEGATIVE

## 2021-11-20 NOTE — Assessment & Plan Note (Signed)
Recommend not go to work to avoid spread, cannot rule out covid 100%, even though home covid negative. Lots of variants. Take off 1-3 days until cough is mostly gone Ok to use otc supportive care.

## 2021-11-20 NOTE — Progress Notes (Signed)
   Russell Beard Russell Beard. is a 21 y.o. male who presents today for an office visit.  Assessment/Plan:  Overview: We ruled out strep throat with throat exam and strep swab due to exposure, and covid was negative.   Gave 3 day work excuse since I dont feel like home covid tests can be fully trusted.   Problem List Items Addressed This Visit       Active Problems   URI (upper respiratory infection)    Recommend not go to work to avoid spread, cannot rule out covid 100%, even though home covid negative. Lots of variants. Take off 1-3 days until cough is mostly gone Ok to use otc supportive care.      Other Visit Diagnoses     Sore throat    -  Primary   Relevant Orders   POCT rapid strep A (Completed)           Subjective:  HPI:  Mr. Noffke normally follows with Evelena Peat at the [facility] location and presents today for 2 days of dry cough congested sinuses and lightheadedness starting yesterday morning.  He is already been tested twice with home test for COVID which were negative.  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  He has no swelling in his neck, but it feels tender inside.  Denies any fevers, but did have suggestive sensation of being hot last night. Denies any cold sweats.  Has felt fatigued and exhausted, no body aches or joint pain.   Swallowing hurts some of the time.   Denies any ear pain but their is some ear pressure and sinus pressure that are both improving since yesterday.  Denies any difficulty breathing.          Objective:  Physical Exam: BP 118/84   Pulse 76   Temp 98 F (36.7 C) (Temporal)   Ht 6\' 2"  (1.88 m)   Wt 199 lb (90.3 kg)   SpO2 97%   BMI 25.55 kg/m    Gen: No acute distress, resting comfortably Psych: Normal affect and thought  content  Problem specific physical exam findings:  No tonsil swelling, cervical lad, CTAB Nasal mucosal redness.        , MD 11/20/2021 11:33 AM

## 2021-11-21 ENCOUNTER — Ambulatory Visit: Payer: BC Managed Care – PPO | Admitting: Family Medicine

## 2021-12-14 ENCOUNTER — Other Ambulatory Visit: Payer: Self-pay | Admitting: Gastroenterology

## 2021-12-17 ENCOUNTER — Other Ambulatory Visit: Payer: Self-pay | Admitting: Gastroenterology

## 2021-12-23 ENCOUNTER — Other Ambulatory Visit: Payer: Self-pay

## 2021-12-23 DIAGNOSIS — R112 Nausea with vomiting, unspecified: Secondary | ICD-10-CM

## 2021-12-23 DIAGNOSIS — R634 Abnormal weight loss: Secondary | ICD-10-CM

## 2021-12-23 DIAGNOSIS — R1013 Epigastric pain: Secondary | ICD-10-CM

## 2021-12-31 ENCOUNTER — Encounter (HOSPITAL_COMMUNITY): Payer: Self-pay | Admitting: Pharmacy Technician

## 2021-12-31 ENCOUNTER — Emergency Department (HOSPITAL_COMMUNITY)
Admission: EM | Admit: 2021-12-31 | Discharge: 2021-12-31 | Disposition: A | Discharge status: Home / Self Care | Payer: BC Managed Care – PPO | Attending: Emergency Medicine | Admitting: Emergency Medicine

## 2021-12-31 ENCOUNTER — Other Ambulatory Visit: Payer: Self-pay

## 2021-12-31 DIAGNOSIS — S060X0A Concussion without loss of consciousness, initial encounter: Secondary | ICD-10-CM | POA: Diagnosis not present

## 2021-12-31 DIAGNOSIS — W19XXXA Unspecified fall, initial encounter: Secondary | ICD-10-CM | POA: Insufficient documentation

## 2021-12-31 DIAGNOSIS — F840 Autistic disorder: Secondary | ICD-10-CM | POA: Insufficient documentation

## 2021-12-31 DIAGNOSIS — S0990XA Unspecified injury of head, initial encounter: Secondary | ICD-10-CM | POA: Diagnosis present

## 2021-12-31 NOTE — Discharge Instructions (Signed)
Follow-up with concussion clinic, call to schedule an appointment. Return to emergency room for worsening or concerning symptoms. Tylenol as needed as directed for headaches.

## 2021-12-31 NOTE — ED Triage Notes (Signed)
Pt here after hitting head multiple times over the last few days. Pt states since then he has had slower speech and concerned about a concussion. Pt also endorses dizziness.

## 2021-12-31 NOTE — ED Provider Notes (Signed)
[facility]-EMERGENCY DEPT Provider Note   CSN: [id number] Arrival date & time: 12/31/21  1201     History  No chief complaint on file.   Russell Beard. is a 21 y.o. male.  21 year old male with history of adhd, autism, kidney stones, dystonia (from Abilify as a child), presents with significant other with concern for head injury. Patient states 3 days ago he hit his head on a metal bed frame when he fell backwards catching something his significant other tossed to him, no LOC, no vomiting. Then the following day, hit the front of his head on a TV when he bent over, again no LOC, no vomiting. States since that time has had some confusion with repetitive questioning and occasional dizziness. 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 Patient is concerned he has a concussion. Patient is not anticoagulated. No history of head injuries.        Home Medications Prior to Admission medications   Medication Sig Start Date End Date Taking? Authorizing Provider  amphetamine-dextroamphetamine (ADDERALL) 20 MG tablet Take 20 mg by mouth daily as needed. 02/27/20   [provider]  citalopram (CELEXA) 20 MG tablet Take 30 mg by mouth at bedtime. 07/22/21   [provider]  cloNIDine (CATAPRES) 0.2 MG tablet Take 0.2 mg by mouth at bedtime. 12/28/17   [provider]  cyanocobalamin (VITAMIN B12) 1000 MCG/ML injection Inject 1 mL (1,000 mcg total) into the muscle every 30 (thirty) days. 12/17/21   Cirigliano, Vito V, DO  JORNAY PM 80 MG CP24 Take 1 capsule by mouth at bedtime. 02/11/20   [provider]  triamcinolone (KENALOG) 0.1 % Apply 1 application topically 2 (two) times daily. Patient taking differently: Apply 1 application  topically as needed. 03/06/20   Burchette, Elberta Fortis, MD  Vitamin D, Ergocalciferol, (DRISDOL) 1.25 MG (50000 UNIT) CAPS capsule TAKE 1  CAPSULE (50,000 UNITS TOTAL) BY MOUTH EVERY 7 (SEVEN) DAYS 11/10/21   Cirigliano, Verlin Dike, DO      Allergies    Nickel    Review of Systems   Review of Systems Negative except as per HPI Physical Exam Updated Vital Signs BP 129/74 (BP Location: Left Arm)   Pulse 78   Temp 98.4 F (36.9 C) (Oral)   Resp 18   SpO2 100%  Physical Exam Vitals and nursing note reviewed.  Constitutional:      General: He is not in acute distress.    Appearance: He is well-developed. He is not diaphoretic.  HENT:     Head: Normocephalic and atraumatic.     Right Ear: Tympanic membrane and ear canal normal.     Left Ear: Tympanic membrane and ear canal normal.     Nose: Nose normal.     Mouth/Throat:     Mouth: Mucous membranes are moist.  Eyes:     Extraocular Movements: Extraocular movements intact.     Pupils: Pupils are equal, round, and reactive to light.  Pulmonary:     Effort: Pulmonary effort is normal.  Musculoskeletal:        General: Normal range of motion.     Cervical back: Normal range of motion and neck supple. No tenderness.  Skin:    General: Skin is warm and dry.     Findings: No erythema or rash.  Neurological:     General: No focal deficit  present.     Mental Status: He is alert and oriented to person, place, and time.     Cranial Nerves: No cranial nerve deficit.     Sensory: No sensory deficit.     Motor: No weakness.     Coordination: Coordination normal.     Gait: Gait normal.     Deep Tendon Reflexes: Reflexes normal.  Psychiatric:        Behavior: Behavior normal.     ED Results / Procedures / Treatments   Labs (all labs ordered are listed, but only abnormal results are displayed) Labs Reviewed - No data to display  EKG None  Radiology No results found.  Procedures Procedures    Medications Ordered in ED Medications - No data to display  ED Course/ Medical Decision Making/ A&P                           Medical Decision Making  21 year old  male with concern for concussion after head injury as above. Patient is able to provide history well on his own, does look to significant other to verify he has the order of events correct. Neuro exam is unremarkable, no scalp hematoma, no evidence of skull fracture. Offered head CT to evaluate for intracranial injury, shared decision making, patient declines at this time. Agrees to return for reconsideration for worsening or concerning symptoms. Suspect concussion, referred to concussion clinic for follow up.   Nexus head CT score low risk.        Final Clinical Impression(s) / ED Diagnoses Final diagnoses:  Concussion without loss of consciousness, initial encounter    Rx / DC Orders ED Discharge Orders     None         Alden Hipp 12/31/21 1252    Lorre Nick, MD 01/01/22 1354

## 2022-03-14 ENCOUNTER — Other Ambulatory Visit: Payer: Self-pay | Admitting: Gastroenterology

## 2022-03-16 ENCOUNTER — Telehealth: Payer: Self-pay

## 2022-03-16 NOTE — Telephone Encounter (Signed)
Received Pharmacy request to refill for Vitamin B12 injection. Called patient and LVM to remind him to have repeat blood work for Vitamin b12 and vitamin D levels. Labs are already ordered. MyChart message was sent to the patient on 12/23/21, and today as well.

## 2022-04-21 DIAGNOSIS — M9903 Segmental and somatic dysfunction of lumbar region: Secondary | ICD-10-CM | POA: Diagnosis not present

## 2022-04-21 DIAGNOSIS — M9901 Segmental and somatic dysfunction of cervical region: Secondary | ICD-10-CM | POA: Diagnosis not present

## 2022-04-21 DIAGNOSIS — M9902 Segmental and somatic dysfunction of thoracic region: Secondary | ICD-10-CM | POA: Diagnosis not present

## 2022-04-21 DIAGNOSIS — M62830 Muscle spasm of back: Secondary | ICD-10-CM | POA: Diagnosis not present

## 2022-04-22 DIAGNOSIS — F900 Attention-deficit hyperactivity disorder, predominantly inattentive type: Secondary | ICD-10-CM | POA: Diagnosis not present

## 2022-04-22 DIAGNOSIS — F418 Other specified anxiety disorders: Secondary | ICD-10-CM | POA: Diagnosis not present

## 2022-04-22 DIAGNOSIS — F845 Asperger's syndrome: Secondary | ICD-10-CM | POA: Diagnosis not present

## 2022-04-23 DIAGNOSIS — M9902 Segmental and somatic dysfunction of thoracic region: Secondary | ICD-10-CM | POA: Diagnosis not present

## 2022-04-23 DIAGNOSIS — M9903 Segmental and somatic dysfunction of lumbar region: Secondary | ICD-10-CM | POA: Diagnosis not present

## 2022-04-23 DIAGNOSIS — M9901 Segmental and somatic dysfunction of cervical region: Secondary | ICD-10-CM | POA: Diagnosis not present

## 2022-04-23 DIAGNOSIS — M62830 Muscle spasm of back: Secondary | ICD-10-CM | POA: Diagnosis not present

## 2022-04-28 DIAGNOSIS — M9902 Segmental and somatic dysfunction of thoracic region: Secondary | ICD-10-CM | POA: Diagnosis not present

## 2022-04-28 DIAGNOSIS — M62830 Muscle spasm of back: Secondary | ICD-10-CM | POA: Diagnosis not present

## 2022-04-28 DIAGNOSIS — M9903 Segmental and somatic dysfunction of lumbar region: Secondary | ICD-10-CM | POA: Diagnosis not present

## 2022-04-28 DIAGNOSIS — M9901 Segmental and somatic dysfunction of cervical region: Secondary | ICD-10-CM | POA: Diagnosis not present

## 2022-04-30 DIAGNOSIS — M62830 Muscle spasm of back: Secondary | ICD-10-CM | POA: Diagnosis not present

## 2022-04-30 DIAGNOSIS — M9903 Segmental and somatic dysfunction of lumbar region: Secondary | ICD-10-CM | POA: Diagnosis not present

## 2022-04-30 DIAGNOSIS — M9901 Segmental and somatic dysfunction of cervical region: Secondary | ICD-10-CM | POA: Diagnosis not present

## 2022-04-30 DIAGNOSIS — M9902 Segmental and somatic dysfunction of thoracic region: Secondary | ICD-10-CM | POA: Diagnosis not present

## 2022-05-04 DIAGNOSIS — M9903 Segmental and somatic dysfunction of lumbar region: Secondary | ICD-10-CM | POA: Diagnosis not present

## 2022-05-04 DIAGNOSIS — M9901 Segmental and somatic dysfunction of cervical region: Secondary | ICD-10-CM | POA: Diagnosis not present

## 2022-05-04 DIAGNOSIS — M9902 Segmental and somatic dysfunction of thoracic region: Secondary | ICD-10-CM | POA: Diagnosis not present

## 2022-05-04 DIAGNOSIS — M62830 Muscle spasm of back: Secondary | ICD-10-CM | POA: Diagnosis not present

## 2022-06-06 DIAGNOSIS — H40033 Anatomical narrow angle, bilateral: Secondary | ICD-10-CM | POA: Diagnosis not present

## 2022-06-06 DIAGNOSIS — H04123 Dry eye syndrome of bilateral lacrimal glands: Secondary | ICD-10-CM | POA: Diagnosis not present

## 2022-06-25 DIAGNOSIS — M25519 Pain in unspecified shoulder: Secondary | ICD-10-CM | POA: Diagnosis not present

## 2022-09-16 ENCOUNTER — Encounter: Payer: Self-pay | Admitting: Internal Medicine

## 2022-09-16 ENCOUNTER — Ambulatory Visit: Payer: BC Managed Care – PPO | Admitting: Internal Medicine

## 2022-09-16 VITALS — BP 144/98 | HR 97 | Temp 98.1°F | Ht 74.0 in | Wt 203.8 lb

## 2022-09-16 DIAGNOSIS — F4329 Adjustment disorder with other symptoms: Secondary | ICD-10-CM | POA: Diagnosis not present

## 2022-09-16 DIAGNOSIS — Z79899 Other long term (current) drug therapy: Secondary | ICD-10-CM

## 2022-09-16 DIAGNOSIS — R030 Elevated blood-pressure reading, without diagnosis of hypertension: Secondary | ICD-10-CM

## 2022-09-16 DIAGNOSIS — F909 Attention-deficit hyperactivity disorder, unspecified type: Secondary | ICD-10-CM | POA: Diagnosis not present

## 2022-09-16 DIAGNOSIS — Z72820 Sleep deprivation: Secondary | ICD-10-CM | POA: Diagnosis not present

## 2022-09-16 NOTE — Patient Instructions (Addendum)
I suspect  that  counseling would be helpful. For reasons discussed .  Many life changes . Make appt with  counselor   male   as planned .  Fu with  PCP as possible .

## 2022-09-16 NOTE — Progress Notes (Signed)
Chief Complaint  Patient presents with   Discussion on Medication    Pt is here for ADHD med. Russell Beard. He reports med is not working for him. His psychiatrist is 2 months out. Wanted to discuss on changing med. Seeing Russell Savage, NP psychiatrist. Advise pt pcp would have to handle med change but will made provider aware. Pt added his fiance just give birth. He haven't been getting his sleep and not drinking water as much as he should.     HPI: Russell Beard. 22 y.o. come in for help with possible med change for his add/adhd. Unable to get to see his prescriber for another 2 months ( appoint got cancelled and  moved forward > he had been on medication since  was 22 yo and every few yeasrs had medication changes when seems to get a tolerance  to good effect  e   Has been on journey on methylphenidate for 4-5 years  Since last few months   not as focused and not as productive . And now has  35 weeks old at home  new born at home.   Time off work  took some time  but not now  Less sleep for obvious reasons   his fiance  is well and baby is well.  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   Personal history of kidney stones    had 1 in 2020 2 in 2022 and 1 at age 20    Family History  Problem Relation Age of Onset   Kidney Stones Mother    Kidney Stones Father    Diabetes Father    Colon cancer Maternal Grandmother    Cancer Maternal Grandfather        Tonsillectomy   Lung cancer Paternal Grandfather    Liver cancer Paternal Grandfather    Colon cancer Other    Breast cancer Other     Social History   Socioeconomic History   Marital status: Single    Spouse name: Not on file   Number  of children: Not on file   Years of education: Not on file   Highest education level: Not on file  Occupational History   Not on file  Tobacco Use   Smoking status: Never   Smokeless tobacco: Never  Vaping Use   Vaping Use: Every day   Devices: Does CBD pen  Substance and Sexual Activity   Alcohol use: Yes    Comment: socially mostly wine and beer rarely a mixed drink   Drug use: No   Sexual activity: Never    Birth control/protection: Abstinence  Other Topics Concern   Not on file  Social History Narrative   Not on file   Social Determinants of Health   Financial Resource Strain: Not on file  Food Insecurity: Not on file  Transportation Needs: Not on file  Physical Activity: Not on file  Stress: Not on file  Social Connections: Not on file    Outpatient Medications Prior to Visit  Medication Sig Dispense Refill   citalopram (CELEXA) 20 MG tablet Take 30 mg by mouth at bedtime.  JORNAY PM 80 MG CP24 Take 1 capsule by mouth at bedtime.     amphetamine-dextroamphetamine (ADDERALL) 20 MG tablet Take 20 mg by mouth daily as needed. (Patient not taking: Reported on 09/16/2022)     cloNIDine (CATAPRES) 0.2 MG tablet Take 0.2 mg by mouth at bedtime. (Patient not taking: Reported on 09/16/2022)  2   cyanocobalamin (VITAMIN B12) 1000 MCG/ML injection Inject 1 mL (1,000 mcg total) into the muscle every 30 (thirty) days. (Patient not taking: Reported on 09/16/2022) 3 mL 0   triamcinolone (KENALOG) 0.1 % Apply 1 application topically 2 (two) times daily. (Patient not taking: Reported on 09/16/2022) 30 g 1   Vitamin D, Ergocalciferol, (DRISDOL) 1.25 MG (50000 UNIT) CAPS capsule TAKE 1 CAPSULE (50,000 UNITS TOTAL) BY MOUTH EVERY 7 (SEVEN) DAYS (Patient not taking: Reported on 09/16/2022) 4 capsule 0   No facility-administered medications prior to visit.     EXAM:  BP (!) 144/98 (BP Location: Right Arm, Patient Position: Sitting, Cuff Size: Normal)   Pulse 97   Temp 98.1 F (36.7 C)  (Oral)   Ht 6\' 2"  (1.88 m)   Wt 203 lb 12.8 oz (92.4 kg)   SpO2 98%   BMI 26.17 kg/m   Body mass index is 26.17 kg/m.  GENERAL: vitals reviewed and listed above, alert, oriented, appears well hydrated and in no acute distress HEENT: atraumatic, conjunctiva  clear, no obvious abnormalities on inspection of external nose and ears NECK: no obvious masses on inspection palpation  MS: moves all extremities without noticeable focal  abnormality PSYCH: pleasant and cooperative, no obvious depression or anxiety well spoken and seems to have insight to stress   Lab Results  Component Value Date   WBC 5.0 09/17/2021   HGB 13.6 09/17/2021   HCT 40.7 09/17/2021   PLT 251.0 09/17/2021   GLUCOSE 100 (H) 09/17/2021   HDL 48 04/23/2018   LDLCALC 14 04/23/2018   ALT 11 09/17/2021   AST 15 09/17/2021   NA 141 09/17/2021   K 4.1 09/17/2021   CL 105 09/17/2021   CREATININE 0.86 09/17/2021   BUN 17 09/17/2021   CO2 29 09/17/2021   HGBA1C 5.4 04/23/2018   BP Readings from Last 3 Encounters:  09/16/22 (!) 144/98  12/31/21 129/74  11/20/21 118/84    ASSESSMENT AND PLAN:  Discussed the following assessment and plan:  Attention deficit hyperactivity disorder (ADHD), unspecified ADHD type - poss aggravated by life changes  adaptation  Medication management  Stress and adjustment reaction - NEWBORN AT HOme adjusting  Sleep deprivation  Elevated blood pressure reading I think would be best served by getting help with counseling about life changes  and interfering with sleep  as opposed to just changing meds .  ( Best done by psych or maybe pcp ) he has been on a number of stimulant med and uncertain what best choice to change would be.  Disc  counseling . Check BP follow up to ensure not elevated  See below  -Patient advised to return or notify health care team  if  new concerns arise.  Patient Instructions  I suspect  that  counseling would be helpful. For reasons discussed .  Many  life changes . Make appt with  counselor   male   as planned .  Fu with  PCP as possible .      Neta Mends. Carlo Lorson M.D.

## 2022-09-20 ENCOUNTER — Encounter: Payer: Self-pay | Admitting: Internal Medicine

## 2022-11-18 ENCOUNTER — Other Ambulatory Visit: Payer: Self-pay | Admitting: Family Medicine

## 2022-11-18 ENCOUNTER — Ambulatory Visit: Payer: BC Managed Care – PPO | Admitting: Family Medicine

## 2022-11-18 ENCOUNTER — Encounter: Payer: Self-pay | Admitting: Family Medicine

## 2022-11-18 VITALS — BP 124/72 | HR 81 | Temp 97.8°F | Ht 74.0 in | Wt 209.4 lb

## 2022-11-18 DIAGNOSIS — B079 Viral wart, unspecified: Secondary | ICD-10-CM | POA: Diagnosis not present

## 2022-11-18 DIAGNOSIS — L989 Disorder of the skin and subcutaneous tissue, unspecified: Secondary | ICD-10-CM

## 2022-11-18 NOTE — Patient Instructions (Signed)
Keep wound dry for the first 24 hours then clean daily with soap and water for one week. Apply vaseline daily for 7 days. Keep covered with clean dressing for 6-7 days. Follow up promptly for any signs of infection such as redness, warmth, pain, or drainage.

## 2022-11-18 NOTE — Progress Notes (Signed)
Established Patient Office Visit  Subjective   Patient ID: Russell Garfias., male    DOB: 01/29/2001  Age: 22 y.o. MRN: [id number]  Chief Complaint  Patient presents with   Skin Problem    Patient complains of skin growth on left arm, X1 year    HPI   Jiovanny is seen with a "growth "on his left forearm which has been noted for about a year.  Initially thought this was an ingrown hair.  He states to some extent this seems to "come and go' in prominence. .  He has scratched at this frequently and may be keeping chronically irritated.  No prior history of skin cancer.  He is now married and he and his wife have 24-month-old son who is doing well.  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 He has never used smokeless tobacco. He reports current alcohol use. He reports that he does not use drugs. family history includes Breast cancer in an other family member; Cancer in his maternal grandfather; Colon cancer in his maternal grandmother and another family member; Diabetes in his father; Kidney Stones in his father and mother; Liver cancer in his paternal grandfather; Lung cancer in his paternal grandfather. Allergies  Allergen Reactions   Nickel Rash    Review of Systems  Constitutional:  Negative for weight loss.      Objective:     BP 124/72 (BP Location: Left Arm, Patient Position: Sitting, Cuff Size: Normal)   Pulse 81   Temp 97.8 F (36.6 C) (Oral)   Ht 6\' 2"  (1.88 m)   Wt 209 lb 6.4 oz (95 kg)   SpO2 98%   BMI 26.89 kg/m  BP Readings from Last 3 Encounters:  11/18/22 124/72  09/16/22 (!) 144/98   12/31/21 129/74   Wt Readings from Last 3 Encounters:  11/18/22 209 lb 6.4 oz (95 kg)  09/16/22 203 lb 12.8 oz (92.4 kg)  11/20/21 199 lb (90.3 kg)      Physical Exam Vitals reviewed.  Constitutional:      Appearance: Normal appearance.  Cardiovascular:     Rate and Rhythm: Normal rate and regular rhythm.  Skin:    Comments: Dorsal left forearm reveals skin lesion with slightly nodular base and hyperkeratotic center.  This is approximately 8 mm diameter at the base.  No ulcerative changes.  Neurological:     Mental Status: He is alert.      No results found for any visits on 11/18/22.    The ASCVD Risk score (Arnett DK, et al., 2019) failed to calculate for the following reasons:   The 2019 ASCVD risk score is only valid for ages 50 to 6    Assessment & Plan:   Problem List Items Addressed This Visit   None Visit Diagnoses     Skin lesion    -  Primary   Relevant Orders   Surgical pathology     Patient presents with 1 year history of  skin lesion left forearm.  Clinically, does not look like basal cell.  Rule out keratoacanthoma/early squamous cell carcinoma in situ versus benign hyperkeratosis.  Discussed risks and benefits of shave excision including risks of bleeding, bruising, scar formation, and infection and patient consented to proceed.  Anesthesia with 1%  plain xylocaine.  Skin prepped with betadine and alcohol and shave excision of lesion with #15 blade with minimal bleeding-controlled with Monsel solution.  Patient tolerated well.  Vaseline and dressing  applied.  -Wound care instruction given -Specimen sent to pathology for further evaluation -Follow-up for any signs of secondary infection   No follow-ups on file.    Evelena Peat, MD

## 2022-11-25 DIAGNOSIS — F845 Asperger's syndrome: Secondary | ICD-10-CM | POA: Diagnosis not present

## 2022-11-25 DIAGNOSIS — F418 Other specified anxiety disorders: Secondary | ICD-10-CM | POA: Diagnosis not present

## 2022-11-25 DIAGNOSIS — F952 Tourette's disorder: Secondary | ICD-10-CM | POA: Diagnosis not present

## 2022-11-25 DIAGNOSIS — Z5181 Encounter for therapeutic drug level monitoring: Secondary | ICD-10-CM | POA: Diagnosis not present

## 2022-11-25 DIAGNOSIS — F900 Attention-deficit hyperactivity disorder, predominantly inattentive type: Secondary | ICD-10-CM | POA: Diagnosis not present

## 2022-12-16 ENCOUNTER — Encounter: Payer: Self-pay | Admitting: Family Medicine

## 2022-12-16 ENCOUNTER — Ambulatory Visit (INDEPENDENT_AMBULATORY_CARE_PROVIDER_SITE_OTHER): Payer: BC Managed Care – PPO | Admitting: Family Medicine

## 2022-12-16 VITALS — BP 124/80 | HR 85 | Temp 98.2°F | Ht 74.0 in | Wt 212.0 lb

## 2022-12-16 DIAGNOSIS — R109 Unspecified abdominal pain: Secondary | ICD-10-CM

## 2022-12-16 DIAGNOSIS — Z87442 Personal history of urinary calculi: Secondary | ICD-10-CM | POA: Diagnosis not present

## 2022-12-16 LAB — POC URINALSYSI DIPSTICK (AUTOMATED)
Bilirubin, UA: NEGATIVE
Blood, UA: POSITIVE
Glucose, UA: NEGATIVE
Ketones, UA: NEGATIVE
Leukocytes, UA: NEGATIVE
Nitrite, UA: NEGATIVE
Protein, UA: NEGATIVE
Spec Grav, UA: 1.02 (ref 1.010–1.025)
Urobilinogen, UA: 0.2 E.U./dL
pH, UA: 6 (ref 5.0–8.0)

## 2022-12-16 NOTE — Progress Notes (Signed)
Established Patient Office Visit  Subjective   Patient ID: Russell Beard., male    DOB: [birth year]  Age: 22 y.o. MRN: [id number]  No chief complaint on file.   HPI   Russell Beard is seen today with concern for possible recent recurrent kidney stone.  He states about a week ago at work he had 4 separate episodes with severe left flank pain radiating anteriorly.  He also noticed change in his urine color from yellow to orange to pink/red and eventually back to yellow.  Denies any abdominal or flank pain at this time.  No burning with urination.  No fever.  He states has had multiple kidney stones in past.  Last urology evaluation was age 22.  He has not had any stone analysis to his knowledge.  Drinks a fair amount of tea.  Tries to stay well-hydrated. He states he had estimated possibly 10 stones thus far.  CT renal stone study back in 2019 showed tiny bilateral nonobstructing renal calculi.  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 He has never used smokeless tobacco. He reports current alcohol use. He reports that he does not use drugs. family history includes Breast cancer in an other family member; Cancer in his maternal grandfather; Colon cancer in his maternal grandmother and another family member; Diabetes in his father; Kidney Stones in his father and mother; Liver cancer in his paternal grandfather; Lung cancer in his paternal grandfather. Allergies  Allergen Reactions   Nickel Rash    Review of Systems  Constitutional:  Negative for chills and fever.  Gastrointestinal:  Negative for abdominal pain, nausea and  vomiting.  Genitourinary:  Positive for flank pain and hematuria.      Objective:     BP 124/80 (BP Location: Left Arm, Patient Position: Sitting, Cuff Size: Normal)   Pulse 85   Temp 98.2 F (36.8 C) (Oral)   Ht 6\' 2"  (1.88 m)   Wt 212 lb (96.2 kg)   SpO2 98%   BMI 27.22 kg/m  BP Readings from Last 3 Encounters:  12/16/22 124/80  11/18/22 124/72  09/16/22 (!) 144/98   Wt Readings from Last 3 Encounters:  12/16/22 212 lb (96.2 kg)  11/18/22 209 lb 6.4 oz (95 kg)  09/16/22 203 lb 12.8 oz (92.4 kg)      Physical Exam Vitals reviewed.  Constitutional:      Appearance: Normal appearance.  Cardiovascular:     Rate and Rhythm: Normal rate and regular rhythm.  Pulmonary:     Effort: Pulmonary effort is normal.     Breath sounds: Normal breath sounds.  Neurological:     Mental Status: He is alert.      No results found for any visits on 12/16/22.    The ASCVD Risk score (Arnett DK, et al., 2019) failed to calculate for the following reasons:   The 2019 ASCVD risk score is only valid for ages 51 to 79    Assessment & Plan:  Recent episode of acute left flank pain with history of multiple kidney stones in the past.  Pain currently resolved.  Symptoms highly suggestive of recent passed stone.  No ongoing symptoms.  -Check urinalysis-shows trace blood otherwise negative.  Suspect this is related to recent stone. -Stressed importance of adequate hydration -Handout given for dietary prevention of kidney stones -Did discuss possible use of Flomax 0.4 mg daily for any recurrent stones and as needed use of pain medication -Urine strainer provided.  If he has future occurrence of stone recommend trying to collect this.  Would like to do stone analysis given his multiple episodes  Evelena Peat, MD

## 2023-01-27 DIAGNOSIS — R112 Nausea with vomiting, unspecified: Secondary | ICD-10-CM | POA: Diagnosis not present

## 2023-01-27 DIAGNOSIS — N200 Calculus of kidney: Secondary | ICD-10-CM | POA: Diagnosis not present

## 2023-01-27 DIAGNOSIS — Z20822 Contact with and (suspected) exposure to covid-19: Secondary | ICD-10-CM | POA: Diagnosis not present

## 2023-01-27 DIAGNOSIS — N134 Hydroureter: Secondary | ICD-10-CM | POA: Diagnosis not present

## 2023-01-27 DIAGNOSIS — N132 Hydronephrosis with renal and ureteral calculous obstruction: Secondary | ICD-10-CM | POA: Diagnosis not present

## 2023-01-27 DIAGNOSIS — R109 Unspecified abdominal pain: Secondary | ICD-10-CM | POA: Diagnosis not present

## 2023-01-27 DIAGNOSIS — R1032 Left lower quadrant pain: Secondary | ICD-10-CM | POA: Diagnosis not present

## 2023-02-24 ENCOUNTER — Ambulatory Visit (INDEPENDENT_AMBULATORY_CARE_PROVIDER_SITE_OTHER): Payer: BC Managed Care – PPO | Admitting: Family Medicine

## 2023-02-24 ENCOUNTER — Encounter: Payer: Self-pay | Admitting: Family Medicine

## 2023-02-24 VITALS — BP 116/80 | HR 93 | Temp 98.2°F | Ht 74.0 in | Wt 212.7 lb

## 2023-02-24 DIAGNOSIS — N5319 Other ejaculatory dysfunction: Secondary | ICD-10-CM | POA: Diagnosis not present

## 2023-02-24 DIAGNOSIS — R5383 Other fatigue: Secondary | ICD-10-CM | POA: Diagnosis not present

## 2023-02-24 DIAGNOSIS — Z87442 Personal history of urinary calculi: Secondary | ICD-10-CM | POA: Diagnosis not present

## 2023-02-24 DIAGNOSIS — R6882 Decreased libido: Secondary | ICD-10-CM

## 2023-02-24 NOTE — Progress Notes (Signed)
Established Patient Office Visit  Subjective   Patient ID: Russell Moyers., male    DOB: Aug 31, 2000  Age: 22 y.o. MRN: [id number]  Chief Complaint  Patient presents with   Sexual Problem   Nephrolithiasis    HPI   Russell Beard is seen to discuss the following issues  History of recurrent kidney stones.  He states he is at least 10 stones in his life.  Recently went to the ER with left flank pain.  CT scan showed tiny bilateral nonobstructing renal calculi.  Suspicion of recent passed stone left ureter.  He used a [employer] bre[employer] and did pass a few small specks that were dark in color.  He has saved these.  He would like to follow-up with urologist.  He seen urologist but years ago.  Tries to drink plenty of fluids.  No pain currently.  No fevers or chills.  He also relates some sexual dysfunction.  He had some decreased libido and also overall decreased energy.  He did have CBC and CMP with recent labs in the ER which were unremarkable.  He is currently working 45 hours/week.  Does have a 67-month-old at home but his wife gets up most of the time during the night.  He sleeps generally fairly well.  Does have some snoring history.  No observed apnea.  He also relates occasional problems with ejaculatory dysfunction and anorgasmia at times.  Also has difficulties with ED.  Non-smoker.  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 He has never used smokeless tobacco. He reports current alcohol use. He reports that he does not use drugs. family history  includes Breast cancer in an other family member; Cancer in his maternal grandfather; Colon cancer in his maternal grandmother and another family member; Diabetes in his father; Kidney Stones in his father and mother; Liver cancer in his paternal grandfather; Lung cancer in his paternal grandfather. Allergies  Allergen Reactions   Nickel Rash     Review of Systems  Constitutional:  Positive for malaise/fatigue. Negative for weight loss.  Eyes:  Negative for blurred vision.  Respiratory:  Negative for shortness of breath.   Cardiovascular:  Negative for chest pain.  Neurological:  Negative for dizziness, weakness and headaches.       Objective:     BP 116/80 (BP Location: Left Arm, Patient Position: Sitting, Cuff Size: Normal)   Pulse 93   Temp 98.2 F (36.8 C) (Oral)   Ht 6\' 2"  (1.88 m)   Wt 212 lb 11.2 oz (96.5 kg)   SpO2 98%   BMI 27.31 kg/m  BP Readings from Last 3 Encounters:  02/24/23 116/80  12/16/22 124/80  11/18/22 124/72   Wt Readings from Last 3 Encounters:  02/24/23 212 lb 11.2 oz (96.5 kg)  12/16/22 212 lb (96.2 kg)  11/18/22 209 lb 6.4 oz (95 kg)  Physical Exam Vitals reviewed.  Constitutional:      Appearance: Normal appearance.  Cardiovascular:     Rate and Rhythm: Normal rate and regular rhythm.     Heart sounds: No murmur heard. Pulmonary:     Effort: Pulmonary effort is normal.     Breath sounds: Normal breath sounds. No wheezing or rales.  Neurological:     Mental Status: He is alert.      No results found for any visits on 02/24/23.  Last CBC Lab Results  Component Value Date   WBC 5.0 09/17/2021   HGB 13.6 09/17/2021   HCT 40.7 09/17/2021   MCV 89.6 09/17/2021   MCH 30.0 03/06/2020   RDW 13.2 09/17/2021   PLT 251.0 09/17/2021   Last metabolic panel Lab Results  Component Value Date   GLUCOSE 100 (H) 09/17/2021   NA 141 09/17/2021   K 4.1 09/17/2021   CL 105 09/17/2021   CO2 29 09/17/2021   BUN 17 09/17/2021    CREATININE 0.86 09/17/2021   GFR 122.14 09/17/2021   CALCIUM 10.2 09/17/2021   PROT 7.4 09/17/2021   ALBUMIN 4.6 09/17/2021   BILITOT 0.4 09/17/2021   ALKPHOS 100 09/17/2021   AST 15 09/17/2021   ALT 11 09/17/2021      The ASCVD Risk score (Arnett DK, et al., 2019) failed to calculate for the following reasons:   The 2019 ASCVD risk score is only valid for ages 9 to 15    Assessment & Plan:   Problem List Items Addressed This Visit       Unprioritized   History of kidney stones   Relevant Orders   Ambulatory referral to Urology   Other Visit Diagnoses     Fatigue, unspecified type    -  Primary   Relevant Orders   TSH   Low libido       Relevant Orders   TSH   Testosterone     History of recurrent kidney stones.  He states he has had well over 10 stones in his lifetime.  Recently passed a few very small flecks and a strainer.  No pain currently.  Recent CT scans showed only tiny bilateral nonobstructive renal calculi  -Set up urology referral -Stressed importance of adequate hydration  Discussed his ED issues and low libido. -Return for total testosterone early morning value along with TSH -If labs normal consider trial of Viagra or Cialis as needed  He also describes some ejaculatory dysfunction.  We stated this may be related to his serotonin reuptake inhibitor.  He had recent change of medications as above.  No follow-ups on file.    Evelena Peat, MD

## 2023-03-04 ENCOUNTER — Other Ambulatory Visit: Payer: BC Managed Care – PPO

## 2023-03-24 ENCOUNTER — Other Ambulatory Visit (INDEPENDENT_AMBULATORY_CARE_PROVIDER_SITE_OTHER): Payer: BC Managed Care – PPO

## 2023-03-24 DIAGNOSIS — R5383 Other fatigue: Secondary | ICD-10-CM | POA: Diagnosis not present

## 2023-03-24 DIAGNOSIS — R6882 Decreased libido: Secondary | ICD-10-CM | POA: Diagnosis not present

## 2023-03-24 DIAGNOSIS — R7989 Other specified abnormal findings of blood chemistry: Secondary | ICD-10-CM

## 2023-03-24 LAB — TESTOSTERONE: Testosterone: 250.24 ng/dL — ABNORMAL LOW (ref 300.00–890.00)

## 2023-03-24 LAB — TSH: TSH: 0.86 u[IU]/mL (ref 0.35–5.50)

## 2023-03-29 ENCOUNTER — Telehealth: Payer: Self-pay | Admitting: Family Medicine

## 2023-03-29 NOTE — Telephone Encounter (Signed)
Please see result note 

## 2023-03-29 NOTE — Telephone Encounter (Signed)
Pt returned call regarding results 

## 2023-04-06 ENCOUNTER — Other Ambulatory Visit: Payer: BC Managed Care – PPO

## 2023-04-06 DIAGNOSIS — R7989 Other specified abnormal findings of blood chemistry: Secondary | ICD-10-CM | POA: Diagnosis not present

## 2023-04-06 DIAGNOSIS — E291 Testicular hypofunction: Secondary | ICD-10-CM | POA: Diagnosis not present

## 2023-04-06 LAB — FOLLICLE STIMULATING HORMONE: FSH: 3.4 m[IU]/mL (ref 1.4–18.1)

## 2023-04-06 LAB — TESTOSTERONE: Testosterone: 293.84 ng/dL — ABNORMAL LOW (ref 300.00–890.00)

## 2023-04-06 LAB — LUTEINIZING HORMONE: LH: 3.67 m[IU]/mL (ref 1.50–9.30)

## 2023-04-07 ENCOUNTER — Other Ambulatory Visit: Payer: BC Managed Care – PPO

## 2023-04-07 LAB — PROLACTIN: Prolactin: 6.2 ng/mL (ref 2.0–18.0)

## 2023-06-09 ENCOUNTER — Encounter: Payer: Self-pay | Admitting: Endocrinology

## 2023-06-09 ENCOUNTER — Telehealth (INDEPENDENT_AMBULATORY_CARE_PROVIDER_SITE_OTHER): Payer: BC Managed Care – PPO | Admitting: Endocrinology

## 2023-06-09 DIAGNOSIS — R7989 Other specified abnormal findings of blood chemistry: Secondary | ICD-10-CM | POA: Diagnosis not present
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
## 2023-06-22 ENCOUNTER — Other Ambulatory Visit: Payer: Self-pay

## 2023-06-30 ENCOUNTER — Other Ambulatory Visit: Payer: Managed Care, Other (non HMO)

## 2023-07-04 LAB — TESTOSTERONE, FREE: TESTOSTERONE FREE: 72.6 pg/mL (ref 46.0–224.0)

## 2023-07-04 LAB — FOLLICLE STIMULATING HORMONE: FSH: 4.2 m[IU]/mL (ref 1.4–12.8)

## 2023-07-04 LAB — LUTEINIZING HORMONE: LH: 4.1 m[IU]/mL (ref 1.5–9.3)

## 2023-07-04 LAB — SEX HORMONE BINDING GLOBULIN: Sex Hormone Binding: 12 nmol/L (ref 10–50)

## 2023-07-04 LAB — TESTOSTERONE, TOTAL, LC/MS/MS: Testosterone, Total, LC-MS-MS: 306 ng/dL (ref 250–1100)

## 2023-07-05 ENCOUNTER — Encounter: Payer: Self-pay | Admitting: Endocrinology

## 2023-07-05 NOTE — Progress Notes (Signed)
 Please notify patient of labs reviewed total testosterone, free testosterone and gonadotropins are normal.  Sex hormone binding globulin is low related to the overweight.  Losing weight would be helpful.  He does not need testosterone medication.  No routine follow-up with endocrinology is required.  Encourage to call our clinic with any questions.

## 2023-07-07 ENCOUNTER — Telehealth: Payer: Self-pay

## 2023-07-07 NOTE — Telephone Encounter (Signed)
 Patient given results  as directed by MD. No further questions at this time.

## 2023-07-07 NOTE — Telephone Encounter (Signed)
-----   Message from [place] Thapa sent at 07/05/2023  8:17 AM EDT ----- Please notify patient of labs reviewed total testosterone, free testosterone and gonadotropins are normal.  Sex hormone binding globulin is low related to the overweight.  Losing weight would be helpful.  He does not need testosterone medication.  No routine follow-up with endocrinology is required.  Encourage to call our clinic with any questions.

## 2023-08-26 ENCOUNTER — Telehealth: Payer: Self-pay | Admitting: Family Medicine

## 2023-08-26 NOTE — Telephone Encounter (Unsigned)
 Copied from CRM [id number]. Topic: Referral - Question >> Aug 26, 2023  3:36 PM Clyde Darling P wrote: Reason for CRM: Pt mother would like to know If pt can be referred to urology as he is getting kidney stones frequently, pt mother prefer for pt to be contacted [phone]

## 2024-03-31 ENCOUNTER — Encounter: Payer: Self-pay | Admitting: Family Medicine

## 2024-03-31 ENCOUNTER — Telehealth: Admitting: Family Medicine

## 2024-03-31 DIAGNOSIS — R300 Dysuria: Secondary | ICD-10-CM | POA: Diagnosis not present

## 2024-03-31 MED ORDER — CEPHALEXIN 500 MG PO CAPS: 500.0000 mg | ORAL_CAPSULE | Freq: Three times a day (TID) | ORAL | 0 refills | Status: AC

## 2024-03-31 NOTE — Progress Notes (Signed)
 Patient ID: Russell Beard., male   DOB: 01/04/2001, 23 y.o.   MRN: [id number]   Virtual Visit via Video Note  I connected with Zachary Anice on 03/31/2024 at 10:45 AM EST by a video enabled telemedicine application and verified that I am speaking with the correct person using two identifiers.  Location patient: home Location provider:work or home office Persons participating in the virtual visit: patient, provider  I discussed the limitations of evaluation and management by telemedicine and the availability of in person appointments. The patient expressed understanding and agreed to proceed.   HPI:  Russell Beard set up virtual visit to discuss 3-day history of burning with urination.  He states he had difficulty getting off work to be here in person.  He had history of 36 prior kidney stones.  He recalls couple days ago having some left flank pain which was generally milder than his usual kidney stones.  Denies any fever.  No gross hematuria.  No flank pain at this time.  No nausea or vomiting.  He is monogamous.  Wife is currently pregnant with her second child.  He denies any recent bladder instrumentation.  No penile discharge.  Low risk for STI  ROS: See pertinent positives and negatives per HPI.  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   Personal history of kidney stones    had 1 in 2020 2 in 2022 and 1 at age 60    Past Surgical History:  Procedure Laterality Date   HAND SURGERY Left    had stitches in hand   WISDOM TOOTH EXTRACTION     age 27    Family History  Problem Relation Age of Onset   Kidney Stones Mother    Kidney Stones Father    Diabetes Father    Colon cancer Maternal Grandmother    Cancer Maternal Grandfather        Tonsillectomy   Lung cancer Paternal Grandfather    Liver cancer Paternal Grandfather    Colon cancer Other    Breast cancer  Other     SOCIAL HX: Non-smoker  Current Medications[1]  EXAM:  VITALS per patient if applicable:  GENERAL: alert, oriented, appears well and in no acute distress  HEENT: atraumatic, conjunttiva clear, no obvious abnormalities on inspection of external nose and ears  NECK: normal movements of the head and neck  LUNGS: on inspection no signs of respiratory distress, breathing rate appears normal, no obvious gross SOB, gasping or wheezing  CV: no obvious cyanosis  MS: moves all visible extremities without noticeable abnormality  PSYCH/NEURO: pleasant and cooperative, no obvious depression or anxiety, speech and thought processing grossly intact  ASSESSMENT AND PLAN:  Discussed the following assessment and plan:  Dysuria-he relates 2-day history of some burning with urination.  History of frequent kidney stones but currently no flank pain.  -We explained that ideally we should get a urine and culture if indicated but he cannot get in today - Agreed to go and cover empirically with Keflex 500 mg 3 times daily for 7 days - Needs office visit and urinalysis and further evaluation if not fully resolved by Monday - Watch closely for any fever or progressive symptoms - Increase hydration - Consider over-the-counter Pyridium 200 mg every 8 hours as needed     I discussed the assessment and treatment plan with  the patient. The patient was provided an opportunity to ask questions and all were answered. The patient agreed with the plan and demonstrated an understanding of the instructions.   The patient was advised to call back or seek an in-person evaluation if the symptoms worsen or if the condition fails to improve as anticipated.     Russell Scarlet, MD      [1]  Current Outpatient Medications:    amphetamine-dextroamphetamine (ADDERALL) 20 MG tablet, Take 20 mg by mouth daily as needed., Disp: , Rfl:    cephALEXin (KEFLEX) 500 MG capsule, Take 1 capsule (500 mg total) by  mouth 3 (three) times daily., Disp: 21 capsule, Rfl: 0   cyanocobalamin  (VITAMIN B12) 1000 MCG/ML injection, Inject 1 mL (1,000 mcg total) into the muscle every 30 (thirty) days., Disp: 3 mL, Rfl: 0   desvenlafaxine (PRISTIQ) 25 MG 24 hr tablet, Take 25 mg by mouth every morning., Disp: , Rfl:    JORNAY PM 80 MG CP24, Take 1 capsule by mouth at bedtime., Disp: , Rfl:    triamcinolone  (KENALOG ) 0.1 %, Apply 1 application topically 2 (two) times daily., Disp: 30 g, Rfl: 1

## 2024-05-03 ENCOUNTER — Ambulatory Visit: Admitting: Family Medicine

## 2024-05-03 VITALS — BP 124/68 | HR 89 | Temp 98.4°F | Wt 202.2 lb

## 2024-05-03 DIAGNOSIS — R300 Dysuria: Secondary | ICD-10-CM | POA: Diagnosis not present

## 2024-05-03 LAB — POC URINALSYSI DIPSTICK (AUTOMATED)
Bilirubin, UA: NEGATIVE
Blood, UA: POSITIVE
Glucose, UA: NEGATIVE
Ketones, UA: NEGATIVE
Leukocytes, UA: NEGATIVE
Nitrite, UA: NEGATIVE
Protein, UA: NEGATIVE
Spec Grav, UA: 1.015
Urobilinogen, UA: 0.2 U/dL
pH, UA: 6

## 2024-05-03 NOTE — Progress Notes (Signed)
 "  Established Patient Office Visit  Subjective   Patient ID: Russell Bieler., male    DOB: 27-Mar-2001  Age: 24 y.o. MRN: [id number]  Chief Complaint  Patient presents with   Dysuria    HPI    Russell Beard is seen with intermittent burning with urination for past month or so.  Was seen by virtual visit on 12 December.  He related at least 36 kidney stones previously.  No recent gross hematuria.  He could not get in for a visit at that point and we ended up empirically starting Keflex  for a week and he states his symptoms did improve some after that.  No recent flank pain.  No nausea or vomiting.  He is monogamous and no risk for STD.  No recent penile discharge.  No gross hematuria.  No slow urinary stream.  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 He has never used smokeless tobacco. He reports current alcohol use. He reports that he does not use drugs. family history includes Breast cancer in an other family member; Cancer in his maternal grandfather; Colon cancer in his maternal grandmother and another family member; Diabetes in his father; Kidney Stones in his father and mother; Liver cancer in his paternal grandfather; Lung cancer in his paternal grandfather. Allergies[1]  Review of Systems  Constitutional:  Negative for chills and fever.  Genitourinary:  Positive for dysuria. Negative for flank pain and hematuria.      Objective:     BP 124/68   Pulse 89   Temp 98.4 F (36.9 C) (Oral)   Wt 202 lb 3.2 oz (91.7 kg)   SpO2 97%   BMI 25.96 kg/m  BP Readings from Last 3 Encounters:  05/03/24 124/68   02/24/23 116/80  12/16/22 124/80   Wt Readings from Last 3 Encounters:  05/03/24 202 lb 3.2 oz (91.7 kg)  02/24/23 212 lb 11.2 oz (96.5 kg)  12/16/22 212 lb (96.2 kg)      Physical Exam Vitals reviewed.  Constitutional:      General: He is not in acute distress.    Appearance: He is not ill-appearing.  Cardiovascular:     Rate and Rhythm: Normal rate and regular rhythm.  Pulmonary:     Effort: Pulmonary effort is normal.     Breath sounds: Normal breath sounds. No wheezing or rales.  Neurological:     Mental Status: He is alert.      Results for orders placed or performed in visit on 05/03/24  POCT Urinalysis Dipstick (Automated)  Result Value Ref Range   Color, UA Yellow    Clarity, UA Clear    Glucose, UA Negative Negative   Bilirubin, UA Negative    Ketones, UA Negative    Spec Grav, UA 1.015 1.010 - 1.025   Blood, UA Positive    pH, UA 6.0 5.0 - 8.0   Protein, UA Negative Negative   Urobilinogen, UA 0.2 0.2 or 1.0 E.U./dL   Nitrite, UA  Negative    Leukocytes, UA Negative Negative      The ASCVD Risk score (Arnett DK, et al., 2019) failed to calculate for the following reasons:   The 2019 ASCVD risk score is only valid for ages 57 to 62   * - Cholesterol units were assumed    Assessment & Plan:   Problem List Items Addressed This Visit   None Visit Diagnoses       Dysuria    -  Primary   Relevant Orders   POCT Urinalysis Dipstick (Automated) (Completed)   Urine Culture     Urine dipstick reveals positive blood but negative nitrite and leukocyte.  Low clinical suspicion for infection but will send culture to be sure.  Stay well-hydrated.  Consider over-the-counter Azo/Pyridium.  If culture negative and symptoms persist consider follow-up with his urologist  No follow-ups on file.    Wolm Scarlet, MD     [1]  Allergies Allergen Reactions   Nickel Rash   "

## 2024-05-03 NOTE — Patient Instructions (Signed)
 Follow up for any fever or persistent symptoms  Urine today does not look infected, but we are sending culture to be sure.

## 2024-05-04 ENCOUNTER — Ambulatory Visit: Payer: Self-pay | Admitting: Family Medicine

## 2024-05-04 LAB — URINE CULTURE
MICRO NUMBER:: 17467988
Result:: NO GROWTH
SPECIMEN QUALITY:: ADEQUATE
